# Patient Record
Sex: Female | Born: 1970 | Hispanic: No | Marital: Married | Smoking: Never smoker
Health system: Southern US, Community
[De-identification: ages and names within clinical notes are randomized; demographics above are authoritative.]

## PROBLEM LIST (undated history)

## (undated) DIAGNOSIS — R519 Headache, unspecified: Secondary | ICD-10-CM

## (undated) DIAGNOSIS — G43909 Migraine, unspecified, not intractable, without status migrainosus: Secondary | ICD-10-CM

## (undated) HISTORY — DX: Migraine, unspecified, not intractable, without status migrainosus: G43.909

## (undated) HISTORY — DX: Headache, unspecified: R51.9

---

## 2000-09-14 ENCOUNTER — Other Ambulatory Visit: Admission: RE | Admit: 2000-09-14 | Discharge: 2000-09-14 | Payer: Self-pay | Admitting: Obstetrics and Gynecology

## 2001-10-06 ENCOUNTER — Other Ambulatory Visit: Admission: RE | Admit: 2001-10-06 | Discharge: 2001-10-06 | Payer: Self-pay | Admitting: Obstetrics and Gynecology

## 2003-02-07 ENCOUNTER — Other Ambulatory Visit: Admission: RE | Admit: 2003-02-07 | Discharge: 2003-02-07 | Payer: Self-pay | Admitting: Obstetrics and Gynecology

## 2004-03-23 ENCOUNTER — Other Ambulatory Visit: Admission: RE | Admit: 2004-03-23 | Discharge: 2004-03-23 | Payer: Self-pay | Admitting: Obstetrics and Gynecology

## 2005-07-26 ENCOUNTER — Other Ambulatory Visit: Admission: RE | Admit: 2005-07-26 | Discharge: 2005-07-26 | Payer: Self-pay | Admitting: Obstetrics and Gynecology

## 2014-07-22 ENCOUNTER — Other Ambulatory Visit: Payer: Self-pay | Admitting: Nephrology

## 2014-07-22 DIAGNOSIS — N289 Disorder of kidney and ureter, unspecified: Secondary | ICD-10-CM

## 2014-07-29 ENCOUNTER — Ambulatory Visit
Admission: RE | Admit: 2014-07-29 | Discharge: 2014-07-29 | Disposition: A | Discharge status: Home / Self Care | Payer: BC Managed Care – PPO | Source: Ambulatory Visit | Attending: Nephrology | Admitting: Nephrology

## 2014-07-29 ENCOUNTER — Other Ambulatory Visit: Payer: Self-pay | Admitting: Chiropractic Medicine

## 2014-07-29 ENCOUNTER — Ambulatory Visit
Admission: RE | Admit: 2014-07-29 | Discharge: 2014-07-29 | Disposition: A | Discharge status: Home / Self Care | Payer: BC Managed Care – PPO | Source: Ambulatory Visit | Attending: Chiropractic Medicine | Admitting: Chiropractic Medicine

## 2014-07-29 DIAGNOSIS — N289 Disorder of kidney and ureter, unspecified: Secondary | ICD-10-CM

## 2014-07-29 DIAGNOSIS — R51 Headache: Secondary | ICD-10-CM

## 2016-02-25 IMAGING — CR DG CERVICAL SPINE COMPLETE 4+V
6 series · 6 of 6 positions shown · non-contrast
Comparison: None.

CLINICAL DATA: Chronic migraine headache.  Neck pain and tightness.

EXAM:
CERVICAL SPINE  4+ VIEWS

[w c-spine a.p.]
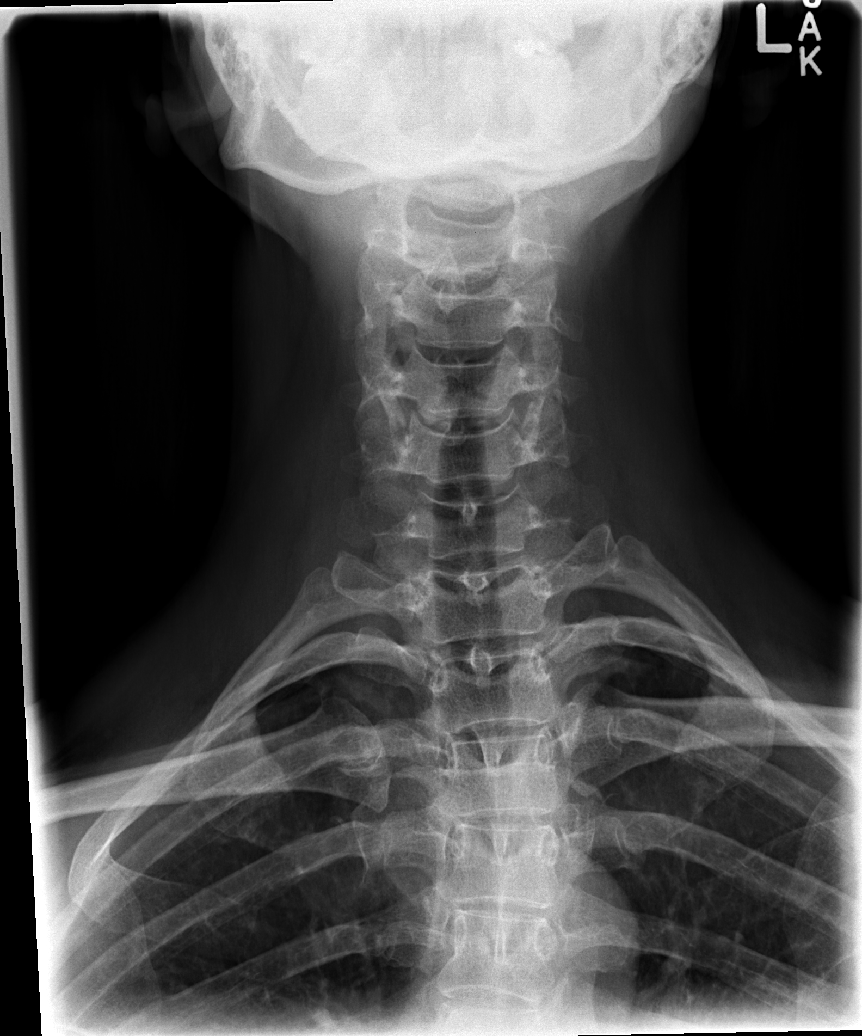

[w c-spine odontoid (1 of 2)]
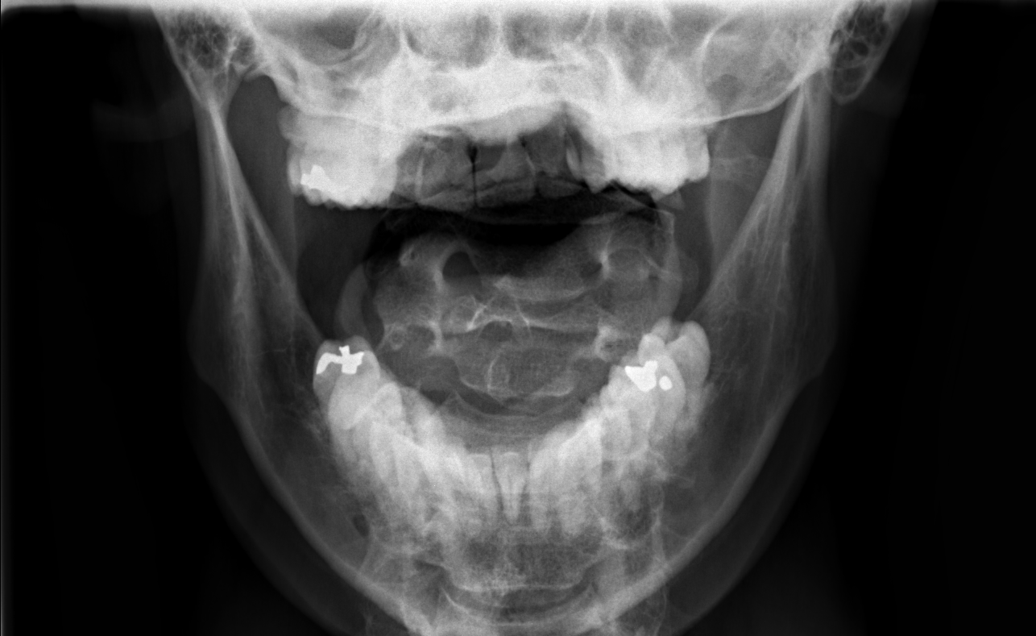

[w c-spine odontoid (2 of 2)]
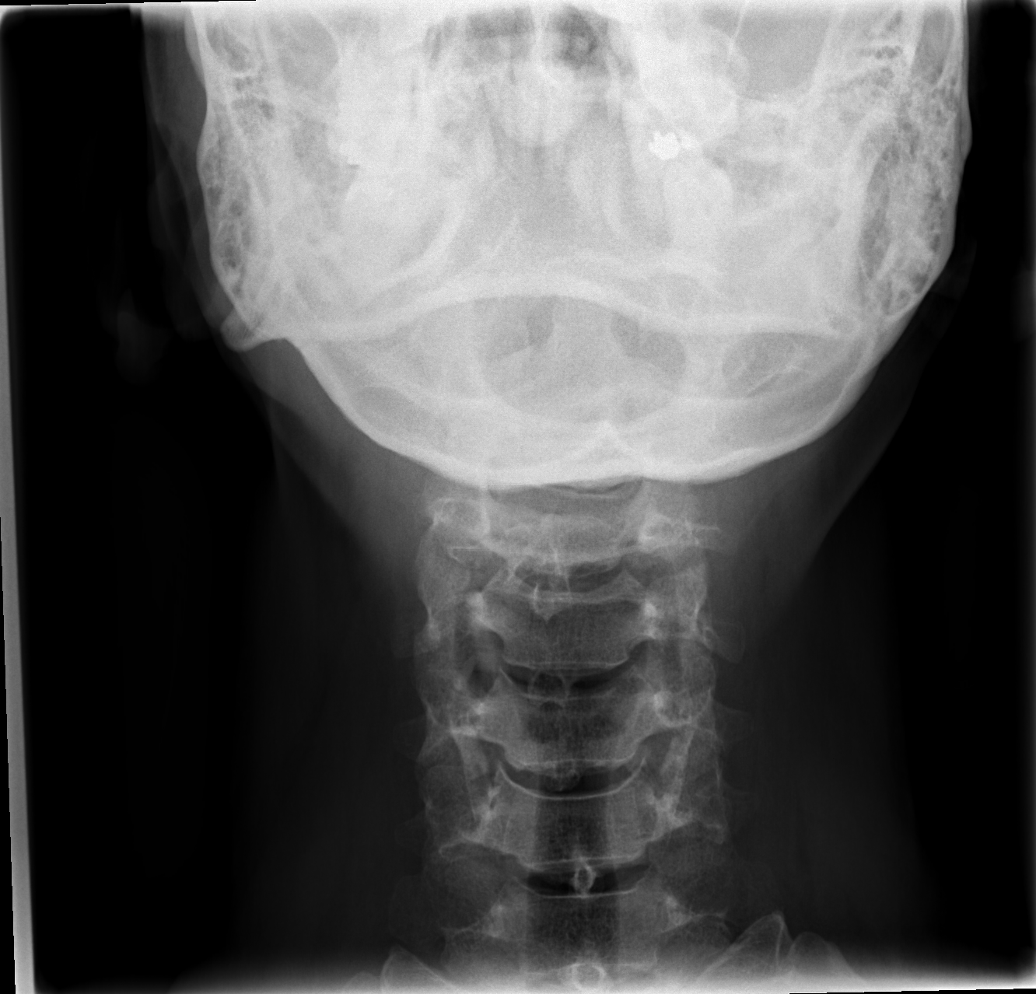

[w c-spine oblique (1 of 2)]
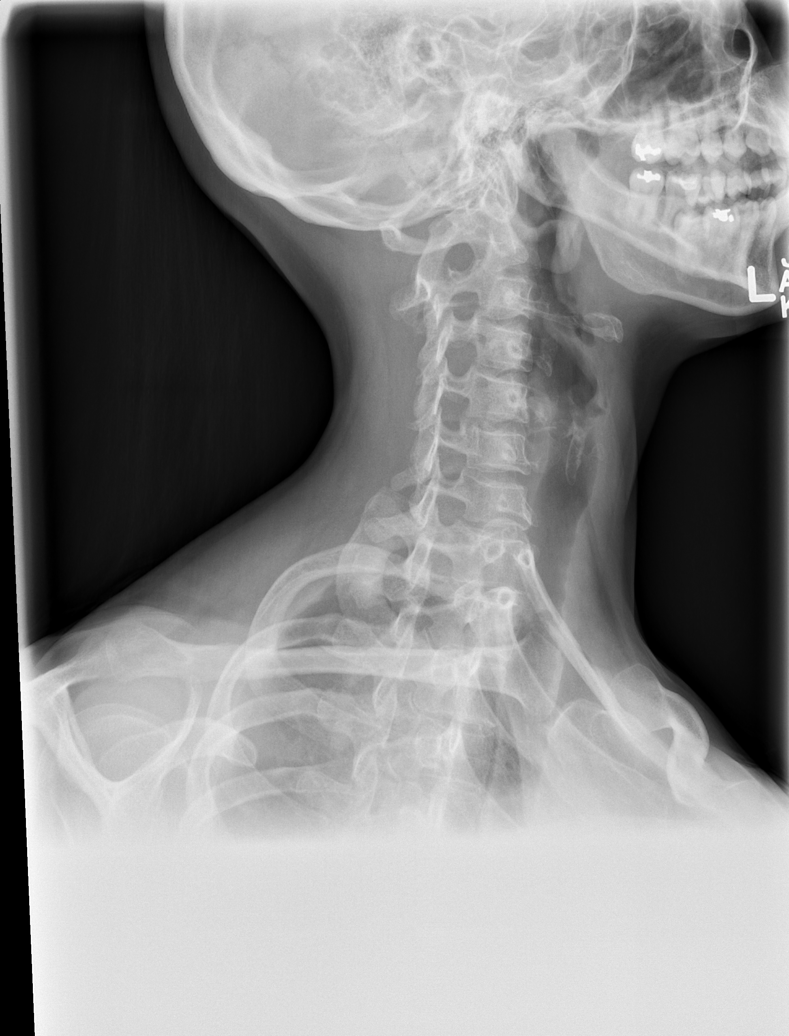

[w c-spine oblique (2 of 2)]
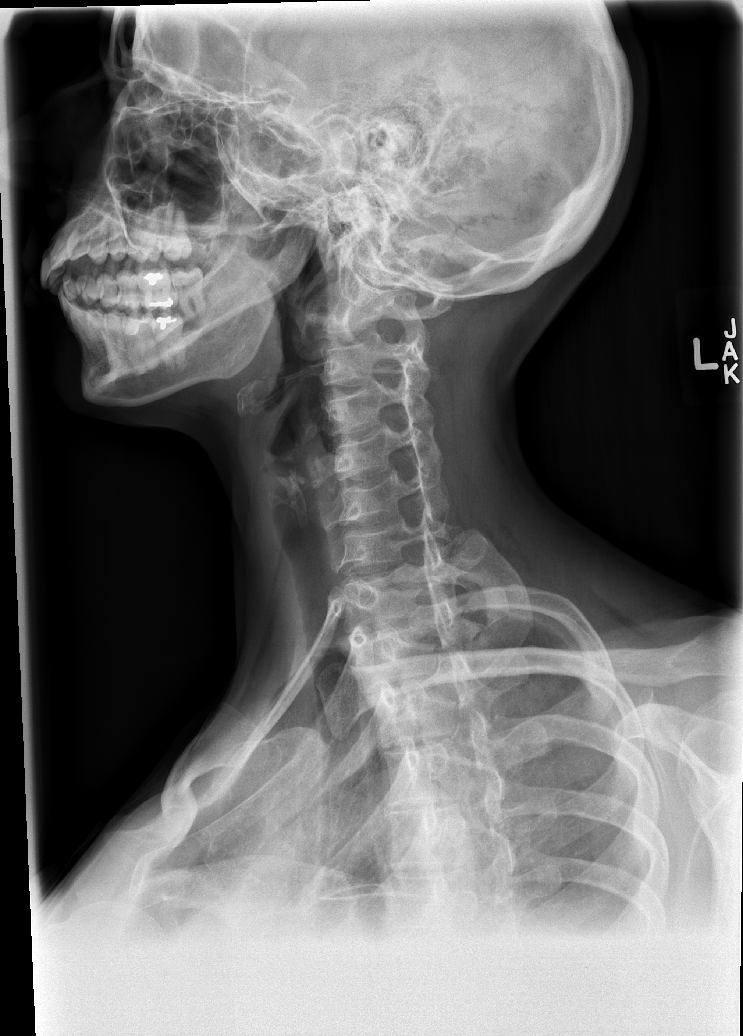

[w c-spine lat]
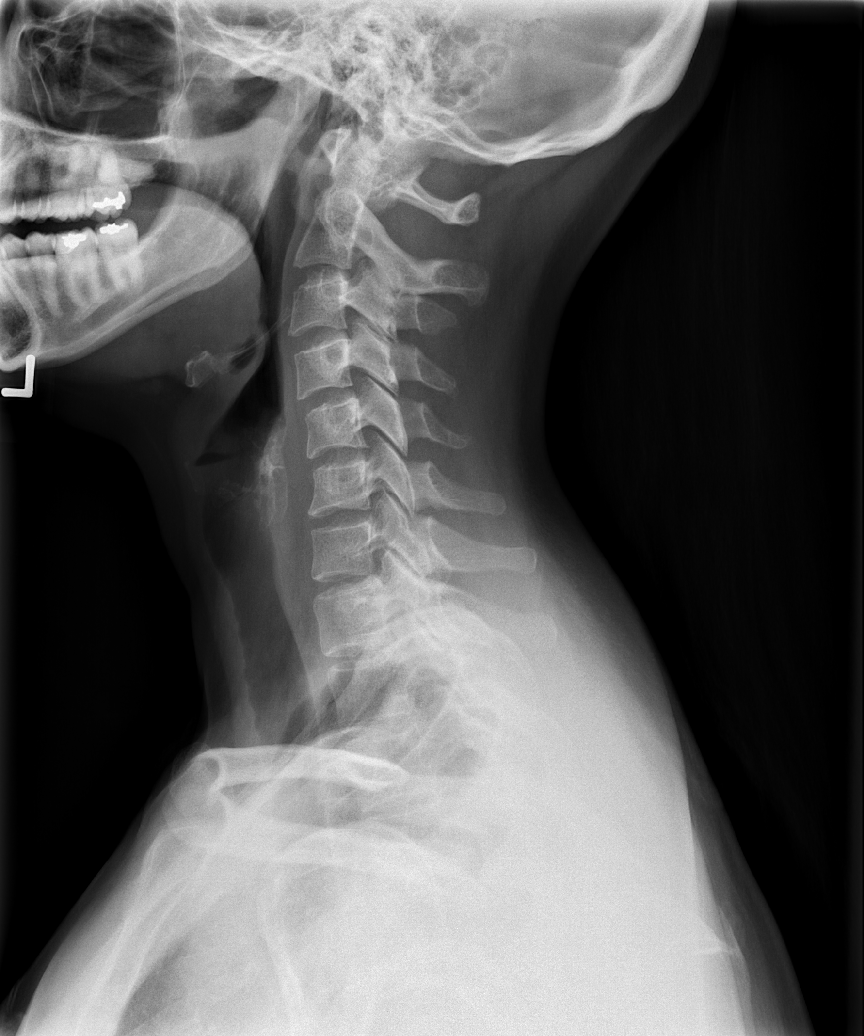

[6 of 6 positions shown; findings below may reference images not displayed]

FINDINGS: Cervical spine vertebral bodies are normally aligned from the
skullbase to the cervicothoracic junction. The vertebral bodies are
normal in height. The disc spaces are maintained. No significant
bony degenerative changes of the cervical spine. The neural foramina
are patent bilaterally. The prevertebral soft tissue contour is
normal. The trachea is midline.
IMPRESSION: Negative cervical spine radiographs.

## 2016-04-06 ENCOUNTER — Other Ambulatory Visit: Payer: Self-pay | Admitting: Obstetrics and Gynecology

## 2016-04-06 DIAGNOSIS — R928 Other abnormal and inconclusive findings on diagnostic imaging of breast: Secondary | ICD-10-CM

## 2016-04-12 ENCOUNTER — Other Ambulatory Visit: Payer: Self-pay

## 2020-10-18 ENCOUNTER — Other Ambulatory Visit: Payer: Self-pay

## 2022-02-22 ENCOUNTER — Encounter: Payer: Self-pay | Admitting: *Deleted

## 2022-02-22 ENCOUNTER — Other Ambulatory Visit: Payer: Self-pay | Admitting: *Deleted

## 2022-02-23 ENCOUNTER — Other Ambulatory Visit: Payer: Self-pay

## 2022-02-23 ENCOUNTER — Encounter: Payer: Self-pay | Admitting: Psychiatry

## 2022-02-23 ENCOUNTER — Ambulatory Visit: Payer: Managed Care, Other (non HMO) | Admitting: Psychiatry

## 2022-02-23 VITALS — BP 122/82 | HR 80 | Ht 68.0 in | Wt 170.0 lb

## 2022-02-23 DIAGNOSIS — G43109 Migraine with aura, not intractable, without status migrainosus: Secondary | ICD-10-CM | POA: Diagnosis not present

## 2022-02-23 DIAGNOSIS — M542 Cervicalgia: Secondary | ICD-10-CM | POA: Diagnosis not present

## 2022-02-23 MED ORDER — RIZATRIPTAN BENZOATE 10 MG PO TABS: ORAL_TABLET | ORAL | 3 refills | Status: DC

## 2022-02-23 NOTE — Patient Instructions (Signed)
Start Botox for headache prevention. The office will call you to schedule this once your insurance approves it ? ?Start physical therapy for the neck ?

## 2022-02-23 NOTE — Progress Notes (Signed)
Rizatriptan would not e-scribe to [employer]. Rx has been faxed to # [phone], confirmation received.  ?

## 2022-02-23 NOTE — Progress Notes (Signed)
? ?Referring:  ?Candice Camp, MD ?[address], [address] ?[place],  [place] [address] ? ?PCP: ?Irena Reichmann, DO ? ?Neurology was asked to evaluate Brenda Torres, a 51 year old female for a chief complaint of headaches.  Our recommendations of care will be communicated by shared medical record.   ? ?CC:  headaches ? ?History provided from: self ? ?HPI:  ?Medical co-morbidities: none ? ?The patient presents for evaluation of headaches which began when she was a young adult. She has noticed an increase in headache frequency in the past few months. She has had 15 headache days in the past month. Migraines are described as frontotemporal pressure with associated photophobia and phonophobia. They last up to 3 days without treatment. She takes Maxalt for rescue which generally improves her headaches within 20-30 minutes. ? 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 Photophobia: yes ? Phonophobia: yes ? Nausea: no ?Other symptoms: occipital notch tenderness ?Worse with activity?: yes ?Duration of headaches: improves in 20-30 minutes with Maxalt, can last 3 days without treatment ? ?Headache days per month: 15 ?Headache free days per month: 15 ? ?Current Treatment: ?Abortive ?Maxalt 10 mg PRN ? ?Preventative ?Magnesium ?B2 ?CoQ10 ? ?Prior Therapies                                 ?Maxalt  ?Imitrex ?Axert ?Zomig ?Frova ?Migranal ?Flurbiprofen ?ketoprofen ?Topamax 175 mg QHS - lack of efficacy ?Atenolol - lack of efficacy ?Magnesium ?B2 ? ? ?LABS: ?08/25/21: CMP with Cr 1.03, otherwise unremarkable. Vitamin D, A1c wnl ? ?IMAGING:  ?States she had a normal CT scan 20 years ago ? ?Current Outpatient Medications on File Prior to Visit  ?Medication Sig Dispense Refill  ? cetirizine (ZYRTEC) 10 MG tablet Zyrtec 10 mg tablet ? Take 1 tablet every day by oral route.    ? Cholecalciferol (VITAMIN D3 PO) Take  by mouth.    ? clindamycin-benzoyl peroxide (BENZACLIN) gel Apply topically 2 (two) times daily.    ? Coenzyme Q10-Vitamin E (QUNOL ULTRA COQ10 PO) Take by mouth.    ? levocetirizine (XYZAL) 5 MG tablet Take 5 mg by mouth every evening.    ? Multiple Vitamin (MULTIVITAMIN ADULT PO) multivitamin    ? Multiple Vitamin (MULTIVITAMIN) capsule Take 1 capsule by mouth daily.    ? norethindrone-ethinyl estradiol-FE (LOESTRIN FE) 1-20 MG-MCG tablet Take 1 tablet by mouth daily.    ? Omega-3 Fatty Acids (FISH OIL PO) Take by mouth.    ? Riboflavin (VITAMIN B2 PO) Take by mouth.    ? spironolactone (ALDACTONE) 25 MG tablet spironolactone 25 mg tablet    ? tazarotene (AVAGE) 0.1 % cream Tazorac 0.1 % topical cream ? APPLY TO THE AFFECTED AREA(S) BY TOPICAL ROUTE ONCE DAILY    ? Triamcinolone Acetonide (NASACORT ALLERGY 24HR NA) Place into the nose.    ? Turmeric (QC TUMERIC COMPLEX PO) Take by mouth.    ? ?No current facility-administered medications on file prior to visit.  ? ? ? ?Allergies: ?Allergies  ?Allergen Reactions  ? Codeine Nausea Only  ?  In college   ? ? ?Family History: ?Migraine or other headaches in the family:  mother ?Aneurysms in a first degree relative:  no ?Brain tumors in the family:  no ?Other neurological illness in the family:   mother has dementia ? ?Past Medical History: ?Past  Medical History:  ?Diagnosis Date  ? Headache   ? Migraine   ? ? ?Past Surgical History ?History reviewed. No pertinent surgical history. ? ?Social History: ?Social History  ? ?Tobacco Use  ? Smoking status: Never  ? Smokeless tobacco: Never  ?Substance Use Topics  ? Alcohol use: Yes  ?  Comment: 1-2/day  ? ? ? ?ROS: ?Negative for fevers, chills. Positive for headaches. All other systems reviewed and negative unless stated otherwise in HPI. ? ? ?Physical Exam:  ? ?Vital Signs: ?BP 122/82   Pulse 80   Ht 5\' 8"  (1.727 m)   Wt 170 lb (77.1 kg)   SpO2 99%   BMI 25.85 kg/m?  ?GENERAL: well appearing,in no acute  distress,alert ?SKIN:  Color, texture, turgor normal. No rashes or lesions ?HEAD:  Normocephalic/atraumatic. ?CV:  RRR ?RESP: Normal respiratory effort ?MSK: +tenderness to palpation over bilateral occiput, neck, and shoulders ? ?NEUROLOGICAL: ?Mental Status: Alert, oriented to person, place and time,Follows commands ?Cranial Nerves: PERRL, visual fields intact to confrontation, extraocular movements intact, facial sensation intact, no facial droop or ptosis, hearing grossly intact, no dysarthria ?Motor: muscle strength 5/5 both upper and lower extremities,no drift, normal tone ?Reflexes: 2+ throughout ?Sensation: intact to light touch all 4 extremities ?Coordination: Finger-to- nose-finger intact bilaterally ?Gait: normal-based ? ? ?IMPRESSION: ?51 year old female who presents for evaluation of chronic migraines. She has failed multiple oral medications due to lack of efficacy. Will start Botox for migraine prevention, which may also help with her neck pain and tension. Maxalt works well for rescue, will continue this for now. Referral to neck PT placed for cervicalgia. Discussed increased stroke risk in patients >35 who have migraine with aura and use estrogen. Advised patient to discuss with ob/gyn about non-estrogen options. She does take ASA 81 daily for stroke prevention. ? ?PLAN: ?-Prevention: Start Botox every 3 months ?-Rescue: Continue Maxalt 10 mg PRN ?-Referral to neck PT for cervicalgia ?-Advised patient to discuss non-estrogen containing birth control options ?-next steps: consider CGRP, amitriptyline ? ?I spent a total of 35 minutes chart reviewing and counseling the patient. Headache education was done. Discussed treatment options including preventive and acute medications, natural supplements, and physical therapy. Discussed medication overuse headache and to limit use of acute treatments to no more than 2 days/week or 10 days/month. Discussed medication side effects, adverse reactions and drug  interactions. Written educational materials and patient instructions outlining all of the above were given. ? ?Follow-up: for Botox ? ? ?54, MD ?02/23/2022   ?11:00 AM ? ? ?

## 2022-03-04 NOTE — Therapy (Signed)
?OUTPATIENT PHYSICAL THERAPY CERVICAL EVALUATION ? ? ?Patient Name: Brenda Torres ?MRN: [id number] ?DOB:1970/12/28, 51 y.o., female ?Today's Date: 03/05/2022 ? ? PT End of Session - 03/05/22 1325   ? ? Visit Number 1   ? Number of Visits 17   ? Date for PT Re-Evaluation 05/07/22   ? Authorization Type [employer]   ? Progress Note Due on Visit 10   ? PT Start Time 1320   ? PT Stop Time 1405   ? PT Time Calculation (min) 45 min   ? Activity Tolerance Patient tolerated treatment well   ? Behavior During Therapy [facility] for tasks assessed/performed   ? ?  ?  ? ?  ? ? ?Past Medical History:  ?Diagnosis Date  ? Headache   ? Migraine   ? ?History reviewed. No pertinent surgical history. ?There are no problems to display for this patient. ? ? ?PCP: Janie Morning, DO ? ?REFERRING PROVIDER: Genia Harold, MD ? ?REFERRING DIAG: Cervicalgia ? ?THERAPY DIAG:  ?Cervicalgia ? ?Cramp and spasm ? ?ONSET DATE: Since children ? ?SUBJECTIVE:                                                                                                                                                                                                        ? ?SUBJECTIVE STATEMENT: ?Pt reports she has neck pain and tension Has 50% of the time. The HAs usually last for 3 days. Her currently HA has lasted 5 days.  ? ?PERTINENT HISTORY:  ?NA ? ?PAIN:  ?Are you having pain? Yes: NPRS scale: 4/10 ?Pain location: neck, ram's horn ?Pain description: ache ?Aggravating factors: No consitent ?Reiving factors: Cold/heat, massage with racketball ?Range 4-6/10 ? ?PRECAUTIONS: None ? ?WEIGHT BEARING RESTRICTIONS No ? ?FALLS:  ?Has patient fallen in last 6 months? No ? ?LIVING ENVIRONMENT: ?No issues ? ?OCCUPATION: Sales ? ?PLOF: Independent ? ?PATIENT GOALS Pain relief ? ?OBJECTIVE:  ? ?DIAGNOSTIC FINDINGS:  ?NA ? ?COGNITION: ?Overall cognitive status: Within functional limits for tasks assessed ? ? ?SENSATION: ?WFL ? ?POSTURE:  ?Forward head c CT step ? ?PALPATION: ?TTP of  the suboccipital, cervical paraspinals, and upper traps R >L  ? ?CERVICAL ROM:  ? ?Active ROM A/PROM (deg) ?03/05/2022  ?Flexion 30  ?Extension 60  ?Right lateral flexion 30  ?Left lateral flexion 25  ?Right rotation 53  ?Left rotation 50  ? Pt reports neck muscle tightness with all cervical motions ? ?UE ROM: ?  UE AROMs are grossly WNLs ? ? ?UE MMT: ?  UE myotome screen is Neg ? ?CERVICAL SPECIAL TESTS:  ?  Spurling's test: Negative and Suboccipital release distraction decreased pt's HA  ? ? ?TODAY'S TREATMENT:  ?- Seated Passive Cervical Retraction  5 reps - 3 hold ?- Supine Cervical Retraction with Towel  10 reps - 3 hold ?- Standing Cervical Retraction with Sidebending  2 reps - 3 hold ?- Seated Cervical Retraction and Rotation  2 reps - 3 hold ?- Seated Upper Trapezius Stretch  2 reps - 15 hold ? ? ?PATIENT EDUCATION:  ?Education details: Eval findings, POC, HEP ?Person educated: Patient ?Education method: Explanation, Demonstration, Tactile cues, Verbal cues, and Handouts ?Education comprehension: verbalized understanding, returned demonstration, verbal cues required, and tactile cues required ? ? ?HOME EXERCISE PROGRAM: ?Access Code: [email or web address] ?URL: [email or web address] ?Date: 03/05/2022 ?Prepared by: Gar Ponto ? ?Exercises ?- Seated Passive Cervical Retraction  - 3 x daily - 7 x weekly - 3 sets - 3-10 reps - 3 hold ?- Supine Cervical Retraction with Towel  - 3 x daily - 7 x weekly - 3 sets - 3-10 reps - 3 hold ?- Standing Cervical Retraction with Sidebending  - 3 x daily - 7 x weekly - 3 sets - 3-10 reps - 3 hold ?- Seated Cervical Retraction and Rotation  - 3 x daily - 7 x weekly - 3 sets - 3-10 reps - 3 hold ?- Seated Upper Trapezius Stretch  - 3 x daily - 7 x weekly - 3 sets - 3-10 reps - 15 hold ? ?ASSESSMENT: ? ?CLINICAL IMPRESSION: ?Patient is a 51 y.o. F who was seen today for physical therapy evaluation and treatment for cervicalgia and HAs.  ? ? ?OBJECTIVE IMPAIRMENTS decreased ROM,  decreased strength, increased muscle spasms, impaired flexibility, postural dysfunction, and pain.  ? ?ACTIVITY LIMITATIONS driving and occupation.  ? ?PERSONAL FACTORS Time since onset of injury/illness/exacerbation are also affecting patient's functional outcome.  ? ? ?REHAB POTENTIAL: Good ? ?CLINICAL DECISION MAKING: Evolving/moderate complexity ? ?EVALUATION COMPLEXITY: Moderate ? ? ?GOALS: ? ?SHORT TERM GOALS=LTGs ? ? ?LONG TERM GOALS: Target date: 04/23/22 ? ?Pt will report a 25% decrease in the incidents of HAs ?Baseline: Experiences Has 50% of the time  ?Goal status: INITIAL ? ?2.  Pt's cervical ROMs will increase by 10d for improved neck function ?Baseline: See flow sheets ?Goal status: INITIAL ? ?3.  Pt will be Ind in a final HEP to maintain achieved LOF and QOL ?Baseline: started on eval ?Goal status: INITIAL ? ?4.  Pt will voice understanding of measures to assist in the management of cervical pain and HAs ?Baseline:  ?Goal status: INITIAL ? ? ?PLAN: ?PT FREQUENCY: 1x/week ? ?PT DURATION: 6 weeks ? ?PLANNED INTERVENTIONS: Therapeutic exercises, Therapeutic activity, Patient/Family education, Dry Needling, Electrical stimulation, Spinal mobilization, Cryotherapy, Moist heat, Taping, Traction, Ultrasound, Ionotophoresis 4mg /ml Dexamethasone, and Manual therapy ? ?PLAN FOR NEXT SESSION: Assess response to HEP, use manual care and TPDN as indicated ? ?Gar Ponto MS, PT ?03/05/22 9:25 PM ? ? ? ? ? ? ? ?

## 2022-03-05 ENCOUNTER — Ambulatory Visit: Payer: Managed Care, Other (non HMO) | Attending: Psychiatry

## 2022-03-05 DIAGNOSIS — M542 Cervicalgia: Secondary | ICD-10-CM | POA: Diagnosis present

## 2022-03-05 DIAGNOSIS — R252 Cramp and spasm: Secondary | ICD-10-CM | POA: Insufficient documentation

## 2022-03-09 ENCOUNTER — Encounter: Payer: Self-pay | Admitting: Psychiatry

## 2022-03-10 ENCOUNTER — Telehealth: Payer: Self-pay | Admitting: Psychiatry

## 2022-03-10 DIAGNOSIS — G43109 Migraine with aura, not intractable, without status migrainosus: Secondary | ICD-10-CM

## 2022-03-10 NOTE — Telephone Encounter (Signed)
Completed a Botox PA form for [employer], placed in Nurse pod for MD signature. ?

## 2022-03-11 NOTE — Telephone Encounter (Signed)
Faxed signed PA form with OV notes to [employer]. ? ? ?

## 2022-03-17 ENCOUNTER — Encounter: Payer: Self-pay | Admitting: Psychiatry

## 2022-03-18 ENCOUNTER — Ambulatory Visit: Payer: Managed Care, Other (non HMO) | Attending: Psychiatry

## 2022-03-18 DIAGNOSIS — M542 Cervicalgia: Secondary | ICD-10-CM | POA: Diagnosis present

## 2022-03-18 DIAGNOSIS — R252 Cramp and spasm: Secondary | ICD-10-CM | POA: Diagnosis present

## 2022-03-18 NOTE — Therapy (Signed)
?OUTPATIENT PHYSICAL THERAPY TREATMENT NOTE ? ? ?Patient Name: Brenda Torres ?MRN: [id number] ?DOB:October 19, 1971, 51 y.o., female ?Today's Date: 03/18/2022 ? ?PCP: Janie Morning, DO ?REFERRING PROVIDER: Janie Morning, DO ? ?END OF SESSION:  ? PT End of Session - 03/18/22 1648   ? ? Visit Number 2   ? Number of Visits 7   ? Date for PT Re-Evaluation 04/23/22   ? Authorization Type [employer]   ? Progress Note Due on Visit 10   ? PT Start Time 2956   ? PT Stop Time 1735   6 minutes of dry needle insertion  ? PT Time Calculation (min) 45 min   ? Activity Tolerance Patient tolerated treatment well   ? Behavior During Therapy [facility] for tasks assessed/performed   ? ?  ?  ? ?  ? ? ?Past Medical History:  ?Diagnosis Date  ? Headache   ? Migraine   ? ?History reviewed. No pertinent surgical history. ?There are no problems to display for this patient. ? ? ?REFERRING DIAG: Cervicalgia ? ?THERAPY DIAG:  ?Cervicalgia ? ?Cramp and spasm ? ? ?SUBJECTIVE: Pt reports continued Rt sided neck pain and stiffness. She reports daily adherence to her HEP.  ? ?PAIN:  ?Are you having pain? Yes: NPRS scale: 1-2/10 ?Pain location: neck, ram's horn ?Pain description: ache ?Aggravating factors: No consitent ?Reiving factors: Cold/heat, massage with racketball ?Range 4-6/10 ? ? ? ? ? ?OBJECTIVE:  ?*Unless otherwise noted, objective information collected previously*  ?DIAGNOSTIC FINDINGS:  ?NA ?  ?COGNITION: ?Overall cognitive status: Within functional limits for tasks assessed ?  ?  ?SENSATION: ?WFL ?  ?POSTURE:  ?Forward head c CT step ?  ?PALPATION: ?TTP of the suboccipital, cervical paraspinals, and upper traps R >L   ?  ?CERVICAL ROM:  ?  ?Active ROM A/PROM (deg) ?03/05/2022  ?Flexion 30  ?Extension 60  ?Right lateral flexion 30  ?Left lateral flexion 25  ?Right rotation 53  ?Left rotation 50  ? Pt reports neck muscle tightness with all cervical motions ?  ?UE ROM: ?                      UE AROMs are grossly WNLs ?  ?  ?UE MMT: ?                       UE myotome screen is Neg ?  ?CERVICAL SPECIAL TESTS:  ?Spurling's test: Negative and Suboccipital release distraction decreased pt's HA  ?  ?  ?TODAY'S TREATMENT:  ? ?[place] [facility] Treatment:                                                DATE: 03/18/2022 ?Therapeutic Exercise: ?Seated low rows with 30# cable and chin tuck hold 2x10 ?Seated high rows with 30# cable and chin tuck hold 2x10 ?Seated lat pull-down with 30# cable and chin tuck hold 2x10 ?Seated shoulder rolls 2x10 forward and backward ?Prone A with chin tuck and 3# dumbbells 2x10 ?Prone T with chin tuck hold and 3# dumbbells ?Prone Y with chin tuck hold and 1# dumbbells 2x10 ?Manual Therapy: ?Skilled palpation to identify trigger points prior to TPDN ?Supine cervical rotation contract/ co-contract MET x5 with 30sec hold at end range BIL ?Supine suboccipital release/ effleurage to cervical multifidi ?Seated Lt UT pin and stretch 2x30sec ?Seated STM  to BIL UT ?Neuromuscular re-ed: ?N/A ?Therapeutic Activity: ?N/A ?Modalities: ?N/A ?Self Care: ?N/A ? ?Trigger Point Dry-Needling  ?Treatment instructions: Expect mild to moderate muscle soreness. S/S of pneumothorax if dry needled over a lung field, and to seek immediate medical attention should they occur. Patient verbalized understanding of these instructions and education. ? ?Patient Consent Given: Yes ?Education handout provided: Yes ?Muscles treated: BIL suboccipitals, BIL splenius capitus, Rt C4-C5 cervical multifidi, BIL UT ?Electrical stimulation performed: No ?Parameters: N/A ?Treatment response/outcome: Multiple twitch responses and improved muscle extensibility ? ? ?03/05/2022 ?- Seated Passive Cervical Retraction  5 reps - 3 hold ?- Supine Cervical Retraction with Towel  10 reps - 3 hold ?- Standing Cervical Retraction with Sidebending  2 reps - 3 hold ?- Seated Cervical Retraction and Rotation  2 reps - 3 hold ?- Seated Upper Trapezius Stretch  2 reps - 15 hold ?  ?  ?PATIENT EDUCATION:  ?Education  details: Eval findings, POC, HEP ?Person educated: Patient ?Education method: Explanation, Demonstration, Tactile cues, Verbal cues, and Handouts ?Education comprehension: verbalized understanding, returned demonstration, verbal cues required, and tactile cues required ?  ?  ?HOME EXERCISE PROGRAM: ?Access Code: [email or web address] ?URL: [email or web address] ?Date: 03/05/2022 ?Prepared by: Gar Ponto ?  ?Exercises ?- Seated Passive Cervical Retraction  - 3 x daily - 7 x weekly - 3 sets - 3-10 reps - 3 hold ?- Supine Cervical Retraction with Towel  - 3 x daily - 7 x weekly - 3 sets - 3-10 reps - 3 hold ?- Standing Cervical Retraction with Sidebending  - 3 x daily - 7 x weekly - 3 sets - 3-10 reps - 3 hold ?- Seated Cervical Retraction and Rotation  - 3 x daily - 7 x weekly - 3 sets - 3-10 reps - 3 hold ?- Seated Upper Trapezius Stretch  - 3 x daily - 7 x weekly - 3 sets - 3-10 reps - 15 hold ?  ?ASSESSMENT: ?  ?CLINICAL IMPRESSION: ?Pt responded excellently to all interventions today, demonstrating good form and no pain with performed therapeutic exercises. She also responded well to TPDN today, reporting therapeutic effect and experiencing no adverse response to treatment. Multiple twitch responses were achieved in needled muscle and tissue extensibility was palpably improved following treatment. She also reports a therapeutic response to manual techniques today. The pt will continue to benefit from skilled PT to address her primary impairments and return to her prior level of function with less limitation. ?  ?  ?OBJECTIVE IMPAIRMENTS decreased ROM, decreased strength, increased muscle spasms, impaired flexibility, postural dysfunction, and pain.  ?  ?ACTIVITY LIMITATIONS driving and occupation.  ?  ?PERSONAL FACTORS Time since onset of injury/illness/exacerbation are also affecting patient's functional outcome.  ?  ?  ?REHAB POTENTIAL: Good ?  ?CLINICAL DECISION MAKING: Evolving/moderate complexity ?   ?EVALUATION COMPLEXITY: Moderate ?  ?  ?GOALS: ?  ?SHORT TERM GOALS=LTGs ?  ?  ?LONG TERM GOALS: Target date: 04/23/22 ?  ?Pt will report a 25% decrease in the incidents of HAs ?Baseline: Experiences Has 50% of the time  ?Goal status: INITIAL ?  ?2.  Pt's cervical ROMs will increase by 10d for improved neck function ?Baseline: See flow sheets ?Goal status: INITIAL ?  ?3.  Pt will be Ind in a final HEP to maintain achieved LOF and QOL ?Baseline: started on eval ?Goal status: INITIAL ?  ?4.  Pt will voice understanding of measures to assist in the management of cervical pain and HAs ?Baseline:  ?  Goal status: INITIAL ?  ?  ?PLAN: ?PT FREQUENCY: 1x/week ?  ?PT DURATION: 6 weeks ?  ?PLANNED INTERVENTIONS: Therapeutic exercises, Therapeutic activity, Patient/Family education, Dry Needling, Electrical stimulation, Spinal mobilization, Cryotherapy, Moist heat, Taping, Traction, Ultrasound, Ionotophoresis 35m/ml Dexamethasone, and Manual therapy ?  ?PLAN FOR NEXT SESSION: Assess response to HEP, use manual care and TPDN as indicated ? ? ? ?TCherie Ouch PT ?03/18/2022, 5:35 PM ? ?  ? ?

## 2022-03-18 NOTE — Patient Instructions (Signed)

## 2022-03-25 NOTE — Therapy (Signed)
?OUTPATIENT PHYSICAL THERAPY TREATMENT NOTE ? ? ?Patient Name: Brenda Torres ?MRN: [id number] ?DOB:[birth year], 51 y.o., female ?Today's Date: 03/26/2022 ? ?PCP: Janie Morning, DO ?REFERRING PROVIDER: Janie Morning, DO ? ?END OF SESSION:  ? PT End of Session - 03/26/22 1440   ? ? Visit Number 3   ? Number of Visits 7   ? Date for PT Re-Evaluation 04/23/22   ? Authorization Type [employer]   ? Progress Note Due on Visit 10   ? PT Start Time 1316   ? PT Stop Time 1400   ? PT Time Calculation (min) 44 min   ? Activity Tolerance Patient tolerated treatment well   ? Behavior During Therapy [facility] for tasks assessed/performed   ? ?  ?  ? ?  ? ? ? ?Past Medical History:  ?Diagnosis Date  ? Headache   ? Migraine   ? ?No past surgical history on file. ?There are no problems to display for this patient. ? ? ?REFERRING DIAG: Cervicalgia ? ?THERAPY DIAG:  ?Cervicalgia ? ?Cramp and spasm ? ? ?SUBJECTIVE: I had a HA Sat, Sun and Monday. By Monday the soreness had resolved after the TPDN and I have not had a HA this week. ? ?PAIN:  ?Are you having pain? Yes: NPRS scale: 0/10 ?Pain location: neck, ram's horn ?Pain description: ache ?Aggravating factors: No consitent ?Reiving factors: Cold/heat, massage with racketball ?Range 4-6/10 ? ?OBJECTIVE:  ?*Unless otherwise noted, objective information collected previously*  ?DIAGNOSTIC FINDINGS:  ?NA ?  ?COGNITION: ?Overall cognitive status: Within functional limits for tasks assessed ?  ?  ?SENSATION: ?WFL ?  ?POSTURE:  ?Forward head c CT step ?  ?PALPATION: ?TTP of the suboccipital, cervical paraspinals, and upper traps R >L   ?  ?CERVICAL ROM:  ?  ?Active ROM A/PROM (deg) ?03/05/2022  ?Flexion 30  ?Extension 60  ?Right lateral flexion 30  ?Left lateral flexion 25  ?Right rotation 53  ?Left rotation 50  ? Pt reports neck muscle tightness with all cervical motions ?  ?UE ROM: ?                      UE AROMs are grossly WNLs ?  ?  ?UE MMT: ?                      UE myotome screen is Neg ?   ?CERVICAL SPECIAL TESTS:  ?Spurling's test: Negative and Suboccipital release distraction decreased pt's HA  ?  ?  ?TODAY'S TREATMENT:  ?[facility] Adult PT Treatment:                                                DATE: 03/26/22 ?Therapeutic Exercise: ?Cervical Retraction  5 reps - 3 hold ?Cervical Retraction with Sidebending  3 reps - 3 hold ?Cervical Retraction and Rotation  3 reps - 3 hold ?Manual Therapy: ?STM to the bilat suboccipitals, cervical paraspinals, upper traps, and ant. temporalis ? ?Trigger Point Dry Needling Treatment: ?Skilled palpation for the Identification of taut muscles and TPs ?Pre-treatment instruction: Patient instructed on dry needling rationale, procedures, and possible side effects including pain during treatment (achy,cramping feeling), bruising, drop of blood, lightheadedness, nausea, sweating. ?Patient Consent Given: Yes ?Education handout provided: Previously provided ?Muscles treated: Bilat suboccipials and splenius capitus ?Needle size and number:  .30x53m 2 ?Electrical stimulation performed: No ?  Parameters: N/A ?Treatment response/outcome: Twitch response elicited and Palpable decrease in muscle tension ?Post-treatment instructions: Patient instructed to expect possible mild to moderate muscle soreness later today and/or tomorrow. Patient instructed in methods to reduce muscle soreness and to continue prescribed HEP. If patient was dry needled over the lung field, patient was instructed on signs and symptoms of pneumothorax and, however unlikely, to see immediate medical attention should they occur. Patient was also educated on signs and symptoms of infection and to seek medical attention should they occur. Patient verbalized understanding of these instructions and education. ? ? ?[facility] Adult PT Treatment:                                                DATE: 03/18/2022 ?Therapeutic Exercise: ?Seated low rows with 30# cable and chin tuck hold 2x10 ?Seated high rows with 30# cable and  chin tuck hold 2x10 ?Seated lat pull-down with 30# cable and chin tuck hold 2x10 ?Seated shoulder rolls 2x10 forward and backward ?Prone A with chin tuck and 3# dumbbells 2x10 ?Prone T with chin tuck hold and 3# dumbbells ?Prone Y with chin tuck hold and 1# dumbbells 2x10 ?Manual Therapy: ?Skilled palpation to identify trigger points prior to TPDN ?Supine cervical rotation contract/ co-contract MET x5 with 30sec hold at end range BIL ?Supine suboccipital release/ effleurage to cervical multifidi ?Seated Lt UT pin and stretch 2x30sec ?Seated STM to BIL UT ?Neuromuscular re-ed: ?N/A ?Therapeutic Activity: ?N/A ?Modalities: ?N/A ?Self Care: ?N/A ? ?Trigger Point Dry-Needling  ?Treatment instructions: Expect mild to moderate muscle soreness. S/S of pneumothorax if dry needled over a lung field, and to seek immediate medical attention should they occur. Patient verbalized understanding of these instructions and education. ? ?Patient Consent Given: Yes ?Education handout provided: Yes ?Muscles treated: BIL suboccipitals, BIL splenius capitus, Rt C4-C5 cervical multifidi, BIL UT ?Electrical stimulation performed: No ?Parameters: N/A ?Treatment response/outcome: Multiple twitch responses and improved muscle extensibility ? ? ?03/05/2022 ?- Seated Passive Cervical Retraction  5 reps - 3 hold ?- Supine Cervical Retraction with Towel  10 reps - 3 hold ?- Standing Cervical Retraction with Sidebending  2 reps - 3 hold ?- Seated Cervical Retraction and Rotation  2 reps - 3 hold ?- Seated Upper Trapezius Stretch  2 reps - 15 hold ?  ?  ?PATIENT EDUCATION:  ?Education details: Eval findings, POC, HEP ?Person educated: Patient ?Education method: Explanation, Demonstration, Tactile cues, Verbal cues, and Handouts ?Education comprehension: verbalized understanding, returned demonstration, verbal cues required, and tactile cues required ?  ?  ?HOME EXERCISE PROGRAM: ?Access Code: [email or web address] ?URL: [email or web address] ?Date:  03/05/2022 ?Prepared by: Gar Ponto ?  ?Exercises ?- Seated Passive Cervical Retraction  - 3 x daily - 7 x weekly - 3 sets - 3-10 reps - 3 hold ?- Supine Cervical Retraction with Towel  - 3 x daily - 7 x weekly - 3 sets - 3-10 reps - 3 hold ?- Standing Cervical Retraction with Sidebending  - 3 x daily - 7 x weekly - 3 sets - 3-10 reps - 3 hold ?- Seated Cervical Retraction and Rotation  - 3 x daily - 7 x weekly - 3 sets - 3-10 reps - 3 hold ?- Seated Upper Trapezius Stretch  - 3 x daily - 7 x weekly - 3 sets - 3-10 reps - 15 hold ?  ?ASSESSMENT: ?  ?  CLINICAL IMPRESSION: ?PT was completed for STM to the suboccipitals, cervical paraspinals, upper trap and ant. temporalis f/b TPDN to the blat suboccipitals and splenius capitus. Pt then completed cervical ROM and flexibility therex per cervical retraction including rotation and lat flexion. Pt tolerated the session without adverse effects. Pt notes the PT sessions and HEP have gone well, but it is too soon to know the benefit of PT as of yet.  ?  ?OBJECTIVE IMPAIRMENTS decreased ROM, decreased strength, increased muscle spasms, impaired flexibility, postural dysfunction, and pain.  ?  ?ACTIVITY LIMITATIONS driving and occupation.  ?  ?PERSONAL FACTORS Time since onset of injury/illness/exacerbation are also affecting patient's functional outcome.  ?  ?  ?REHAB POTENTIAL: Good ?  ?CLINICAL DECISION MAKING: Evolving/moderate complexity ?  ?EVALUATION COMPLEXITY: Moderate ?  ?  ?GOALS: ?  ?SHORT TERM GOALS=LTGs ?  ?  ?LONG TERM GOALS: Target date: 04/23/22 ?  ?Pt will report a 25% decrease in the incidents of HAs ?Baseline: Experiences Has 50% of the time  ?Goal status: INITIAL ?  ?2.  Pt's cervical ROMs will increase by 10d for improved neck function ?Baseline: See flow sheets ?Goal status: INITIAL ?  ?3.  Pt will be Ind in a final HEP to maintain achieved LOF and QOL ?Baseline: started on eval ?Goal status: INITIAL ?  ?4.  Pt will voice understanding of measures to  assist in the management of cervical pain and HAs ?Baseline:  ?Goal status: INITIAL ?  ?  ?PLAN: ?PT FREQUENCY: 1x/week ?  ?PT DURATION: 6 weeks ?  ?PLANNED INTERVENTIONS: Therapeutic exercises, Therapeutic activity

## 2022-03-26 ENCOUNTER — Ambulatory Visit: Payer: Managed Care, Other (non HMO)

## 2022-03-26 DIAGNOSIS — M542 Cervicalgia: Secondary | ICD-10-CM

## 2022-03-26 DIAGNOSIS — R252 Cramp and spasm: Secondary | ICD-10-CM

## 2022-03-30 NOTE — Telephone Encounter (Signed)
Please send Botox RX to [facility]. 

## 2022-03-30 NOTE — Telephone Encounter (Signed)
Received approval from [place], [place] # [id number] (03/11/2022-03/12/2023). ?

## 2022-03-31 MED ORDER — ONABOTULINUMTOXINA 100 UNITS IJ SOLR: 100.0000 [IU] | Freq: Once | INTRAMUSCULAR | Status: DC

## 2022-03-31 NOTE — Addendum Note (Signed)
Addended by: [place] Pink on: 03/31/2022 10:26 AM ? ? Modules accepted: Orders ? ?

## 2022-03-31 NOTE — Telephone Encounter (Signed)
Per Danne Harbor, Botox coordinator:  ?She gets 155 units so we would need to order a 200 unit vial quantity of 1.   ?Botox 100 units x 2 vials ordered with co sign required by Dr Marjory Lies as work in [place] in Dr Quentin Mulling absence.  ?

## 2022-04-01 NOTE — Therapy (Signed)
?OUTPATIENT PHYSICAL THERAPY TREATMENT NOTE ? ? ?Patient Name: Brenda Torres ?MRN: [id number] ?DOB:03/04/1971, 51 y.o., female ?Today's Date: 04/02/2022 ? ?PCP: Irena Reichmannollins, Dana, DO ?REFERRING PROVIDER: Irena Reichmannollins, Dana, DO ? ?END OF SESSION:  ? PT End of Session - 04/02/22 1433   ? ? Visit Number 4   ? Number of Visits 7   ? Date for PT Re-Evaluation 04/23/22   ? Authorization Type [employer]   ? Progress Note Due on Visit 10   ? PT Start Time 1315   ? PT Stop Time 1436   ? PT Time Calculation (min) 81 min   ? Activity Tolerance Patient tolerated treatment well   ? [facility] tasks assessed/performed   ? ?  ?  ? ?  ? ? ? ? ?Past Medical History:  ?Diagnosis Date  ? Headache   ? Migraine   ? ?History reviewed. No pertinent surgical history. ?There are no problems to display for this patient. ? ? ?REFERRING DIAG: Cervicalgia ? ?THERAPY DIAG:  ?Cervicalgia ? ?Cramp and spasm ? ? ?SUBJECTIVE:  ?The frequency of my HAs is about the same 3 to 4x a week. On her own 0-5 scale, the intensity has decreased from a 3/5 to a 2/5. Pt notes her Has seem to be primarily on the side of head. Pt notes she does grind her teeth at night. ? ?PAIN:  ?Are you having pain? Yes: NPRS scale: 0/10 ?Pain location: neck, ram's horn ?Pain description: ache ?Aggravating factors: No consitent ?Reiving factors: Cold/heat, massage with racketball ?Range 4-6/10 ? ?OBJECTIVE:  ?*Unless otherwise noted, objective information collected previously*  ?DIAGNOSTIC FINDINGS:  ?NA ?  ?COGNITION: ?Overall cognitive status: Within functional limits for tasks assessed ?  ?  ?SENSATION: ?WFL ?  ?POSTURE:  ?Forward head c CT step ?  ?PALPATION: ?TTP of the suboccipital, cervical paraspinals, and upper traps R >L   ?  ?CERVICAL ROM:  ?  ?Active ROM A/PROM (deg) ?03/05/2022  ?Flexion 30  ?Extension 60  ?Right lateral flexion 30  ?Left lateral flexion 25  ?Right rotation 53  ?Left rotation 50  ? Pt reports neck muscle tightness with all cervical motions ?   ?UE ROM: ?                      UE AROMs are grossly WNLs ?  ?  ?UE MMT: ?                      UE myotome screen is Neg ?  ?CERVICAL SPECIAL TESTS:  ?Spurling's test: Negative and Suboccipital release distraction decreased pt's HA  ?  ?  ?TODAY'S TREATMENT:  ?[facility]                                                DATE: 04/02/22 ?Manual Therapy: ?STM to the bilat suboccipitals, cervical paraspinals, upper traps, and temporalis ? ?Trigger Point Dry Needling Treatment: ?Skilled palpation for the Identification of taut muscles and TPs ?Pre-treatment instruction: Patient instructed on dry needling rationale, procedures, and possible side effects including pain during treatment (achy,cramping feeling), bruising, drop of blood, lightheadedness, nausea, sweating. ?Patient Consent Given: Yes ?Education handout provided: Previously provided ?Muscles treated: Bilat temporalis ?Needle size and number: .30x5030mm 2 ?Electrical stimulation performed: No ?Parameters: N/A ?Treatment response/outcome: Sharp prick and deep  ache ?Post-treatment instructions: Patient instructed to expect possible mild to moderate muscle soreness later today and/or tomorrow. Patient instructed in methods to reduce muscle soreness and to continue prescribed HEP. If patient was dry needled over the lung field, patient was instructed on signs and symptoms of pneumothorax and, however unlikely, to see immediate medical attention should they occur. Patient was also educated on signs and symptoms of infection and to seek medical attention should they occur. Patient verbalized understanding of these instructions and education. ? ? ? ?[facility] Adult PT Treatment:                                                DATE: 03/26/22 ?Therapeutic Exercise: ?Cervical Retraction  5 reps - 3 hold ?Cervical Retraction with Sidebending  3 reps - 3 hold ?Cervical Retraction and Rotation  3 reps - 3 hold ?Manual Therapy: ?STM to the bilat suboccipitals, cervical  paraspinals, upper traps, and ant. temporalis ? ?Trigger Point Dry Needling Treatment: ?Skilled palpation for the Identification of taut muscles and TPs ?Pre-treatment instruction: Patient instructed on dry needling rationale, procedures, and possible side effects including pain during treatment (achy,cramping feeling), bruising, drop of blood, lightheadedness, nausea, sweating. ?Patient Consent Given: Yes ?Education handout provided: Previously provided ?Muscles treated: Bilat suboccipials and splenius capitus ?Needle size and number:  .30x57mm 2 ?Electrical stimulation performed: No ?Parameters: N/A ?Treatment response/outcome: sharp prick and deep ache ?Post-treatment instructions: Patient instructed to expect possible mild to moderate muscle soreness later today and/or tomorrow. Patient instructed in methods to reduce muscle soreness and to continue prescribed HEP. If patient was dry needled over the lung field, patient was instructed on signs and symptoms of pneumothorax and, however unlikely, to see immediate medical attention should they occur. Patient was also educated on signs and symptoms of infection and to seek medical attention should they occur. Patient verbalized understanding of these instructions and education ? ? ?03/05/2022 ?- Seated Passive Cervical Retraction  5 reps - 3 hold ?- Supine Cervical Retraction with Towel  10 reps - 3 hold ?- Standing Cervical Retraction with Sidebending  2 reps - 3 hold ?- Seated Cervical Retraction and Rotation  2 reps - 3 hold ?- Seated Upper Trapezius Stretch  2 reps - 15 hold ?  ?  ?PATIENT EDUCATION:  ?Education details: Eval findings, POC, HEP ?Person educated: Patient ?Education method: Explanation, Demonstration, Tactile cues, Verbal cues, and Handouts ?Education comprehension: verbalized understanding, returned demonstration, verbal cues required, and tactile cues required ?  ?  ?HOME EXERCISE PROGRAM: ?Access Code: [email or web address] ?URL:  [email or web address] ?Date: 03/05/2022 ?Prepared by: Joellyn Rued ?  ?Exercises ?- Seated Passive Cervical Retraction  - 3 x daily - 7 x weekly - 3 sets - 3-10 reps - 3 hold ?- Supine Cervical Retraction with Towel  - 3 x daily - 7 x weekly - 3 sets - 3-10 reps - 3 hold ?- Standing Cervical Retraction with Sidebending  - 3 x daily - 7 x weekly - 3 sets - 3-10 reps - 3 hold ?- Seated Cervical Retraction and Rotation  - 3 x daily - 7 x weekly - 3 sets - 3-10 reps - 3 hold ?- Seated Upper Trapezius Stretch  - 3 x daily - 7 x weekly - 3 sets - 3-10 reps - 15 hold ?  ?ASSESSMENT: ?  ?CLINICAL IMPRESSION: ?PT provided STM to the  cervical, upper shoulder and and temporalis muscles. TPDN was completed to the taut muscle bands of the temporalis muscles bilat.  Pt experienced a sharp prick and deep ache. Pt tolerated the session without adverse effects. Pt response to PT thus far has been minimal relief. Will reassess next visit for continuation vs. DC from PT services. ?   ?OBJECTIVE IMPAIRMENTS decreased ROM, decreased strength, increased muscle spasms, impaired flexibility, postural dysfunction, and pain.  ?  ?ACTIVITY LIMITATIONS driving and occupation.  ?  ?PERSONAL FACTORS Time since onset of injury/illness/exacerbation are also affecting patient's functional outcome.  ?  ?  ?REHAB POTENTIAL: Good ?  ?CLINICAL DECISION MAKING: Evolving/moderate complexity ?  ?EVALUATION COMPLEXITY: Moderate ?  ?  ?GOALS: ?  ?SHORT TERM GOALS=LTGs ?  ?  ?LONG TERM GOALS: Target date: 04/23/22 ?  ?Pt will report a 25% decrease in the incidents of HAs ?Baseline: Experiences Has 50% of the time  ?Goal status: INITIAL ?  ?2.  Pt's cervical ROMs will increase by 10d for improved neck function ?Baseline: See flow sheets ?Goal status: INITIAL ?  ?3.  Pt will be Ind in a final HEP to maintain achieved LOF and QOL ?Baseline: started on eval ?Goal status: INITIAL ?  ?4.  Pt will voice understanding of measures to assist in the  management of cervical pain and HAs ?Baseline:  ?Goal status: INITIAL ?  ?  ?PLAN: ?PT FREQUENCY: 1x/week ?  ?PT DURATION: 6 weeks ?  ?PLANNED INTERVENTIONS: Therapeutic exercises, Therapeutic activity, Patient/Family education, Dry Needl

## 2022-04-02 ENCOUNTER — Ambulatory Visit: Payer: Managed Care, Other (non HMO)

## 2022-04-02 DIAGNOSIS — M542 Cervicalgia: Secondary | ICD-10-CM

## 2022-04-02 DIAGNOSIS — R252 Cramp and spasm: Secondary | ICD-10-CM

## 2022-04-08 NOTE — Therapy (Signed)
?OUTPATIENT PHYSICAL THERAPY TREATMENT NOTE/Discharge ? ? ?Patient Name: Brenda Torres ?MRN: [id number] ?DOB:09/07/1971, 51 y.o., female ?Today's Date: 04/10/2022 ? ?PCP: Janie Morning, DO ?REFERRING PROVIDER: Genia Harold, MD ? ?END OF SESSION:  ? PT End of Session - 04/10/22 1850   ? ? Visit Number 5   ? Number of Visits 7   ? Date for PT Re-Evaluation 04/23/22   ? Authorization Type [employer]   ? Progress Note Due on Visit 10   ? PT Start Time 1320   ? PT Stop Time 1400   ? PT Time Calculation (min) 40 min   ? Activity Tolerance Patient tolerated treatment well   ? Behavior During Therapy [facility] for tasks assessed/performed   ? ?  ?  ? ?  ? ? ? ? ? ?Past Medical History:  ?Diagnosis Date  ? Headache   ? Migraine   ? ?History reviewed. No pertinent surgical history. ?There are no problems to display for this patient. ? ? ?REFERRING DIAG: Cervicalgia ? ?THERAPY DIAG:  ?Cervicalgia ? ?Cramp and spasm ? ? ?SUBJECTIVE:  ?Pt reports her HAs have not changed since the initiation of PT. Pt does report her neck pain has improved.  ? ?PAIN:  ?Are you having pain? Yes: NPRS scale: 0/10 ?Pain location: neck, ram's horn ?Pain description: ache ?Aggravating factors: No consitent ?Reiving factors: Cold/heat, massage with racketball ?Range 4-6/10 ? ?OBJECTIVE:  ?*Unless otherwise noted, objective information collected previously*  ?DIAGNOSTIC FINDINGS:  ?NA ?  ?COGNITION: ?Overall cognitive status: Within functional limits for tasks assessed ?  ?  ?SENSATION: ?WFL ?  ?POSTURE:  ?Forward head c CT step ?  ?PALPATION: ?TTP of the suboccipital, cervical paraspinals, and upper traps R >L   ?  ?CERVICAL ROM:  ?  ?Active ROM A/PROM (deg) ?03/05/2022 AROM ?04/09/22  ?Flexion 30 40  ?Extension 60 60  ?Right lateral flexion 30 34  ?Left lateral flexion 25 29  ?Right rotation 53 65  ?Left rotation 50 60  ? Pt reports neck muscle tightness with all cervical motions ?  ?UE ROM: ?                      UE AROMs are grossly WNLs ?  ?  ?UE MMT: ?                       UE myotome screen is Neg ?  ?CERVICAL SPECIAL TESTS:  ?Spurling's test: Negative and Suboccipital release distraction decreased pt's HA  ?  ?  ?TODAY'S TREATMENT:  ?[facility] Adult PT Treatment:                                                DATE: 04/09/22 ? ?Therapeutic Exercise: ?Cervical Retraction  5 reps - 3 hold ?Cervical Retraction with Side bending  3 reps - 3 hold ?Cervical Retraction and Rotation  3 reps - 3 hold ?Cervical side bending c C/R 3 reps - 10 sec ?Cervical rotation c C/R 3 reps - 10 sec ?HEP update ?Manual Therapy: ?STM and DTM to the cervical paraspinals, upper traps, and suboccipitals ?Suboccipital release ?Grade ll and lll UPAs to C2-C5 ?C/R ROM for cervical side bending L and R ? ?[facility] Adult PT Treatment:  DATE: 04/02/22 ?Manual Therapy: ?STM to the bilat suboccipitals, cervical paraspinals, upper traps, and temporalis ? ?Trigger Point Dry Needling Treatment: ?Skilled palpation for the Identification of taut muscles and TPs ?Pre-treatment instruction: Patient instructed on dry needling rationale, procedures, and possible side effects including pain during treatment (achy,cramping feeling), bruising, drop of blood, lightheadedness, nausea, sweating. ?Patient Consent Given: Yes ?Education handout provided: Previously provided ?Muscles treated: Bilat temporalis ?Needle size and number: .30x70m 2 ?Electrical stimulation performed: No ?Parameters: N/A ?Treatment response/outcome: Sharp prick and deep ache ?Post-treatment instructions: Patient instructed to expect possible mild to moderate muscle soreness later today and/or tomorrow. Patient instructed in methods to reduce muscle soreness and to continue prescribed HEP. If patient was dry needled over the lung field, patient was instructed on signs and symptoms of pneumothorax and, however unlikely, to see immediate medical attention should they occur. Patient was also educated on signs and  symptoms of infection and to seek medical attention should they occur. Patient verbalized understanding of these instructions and education. ? ? ? [facility]                                                [facility] ?Therapeutic Exercise: ?Cervical Retraction  5 reps - 3 hold ?Cervical Retraction with Sidebending  3 reps - 3 hold ?Cervical Retraction and Rotation  3 reps - 3 hold ?Manual Therapy: ?STM to the bilat suboccipitals, cervical paraspinals, upper traps, and ant. temporalis ? ?Trigger Point Dry Needling Treatment: ?Skilled palpation for the Identification of taut muscles and TPs ?Pre-treatment instruction: Patient instructed on dry needling rationale, procedures, and possible side effects including pain during treatment (achy,cramping feeling), bruising, drop of blood, lightheadedness, nausea, sweating. ?Patient Consent Given: Yes ?Education handout provided: Previously provided ?Muscles treated: Bilat suboccipials and splenius capitus ?Needle size and number:  .30x387m2 ?Electrical stimulation performed: No ?Parameters: N/A ?Treatment response/outcome: sharp prick and deep ache ?Post-treatment instructions: Patient instructed to expect possible mild to moderate muscle soreness later today and/or tomorrow. Patient instructed in methods to reduce muscle soreness and to continue prescribed HEP. If patient was dry needled over the lung field, patient was instructed on signs and symptoms of pneumothorax and, however unlikely, to see immediate medical attention should they occur. Patient was also educated on signs and symptoms of infection and to seek medical attention should they occur. Patient verbalized understanding of these instructions and education ? ? ?03/05/2022 ?- Seated Passive Cervical Retraction  5 reps - 3 hold ?- Supine Cervical Retraction with Towel  10 reps - 3 hold ?- Standing Cervical Retraction with Sidebending  2 reps - 3 hold ?- Seated Cervical Retraction and Rotation  2  reps - 3 hold ?- Seated Upper Trapezius Stretch  2 reps - 15 hold ?  ?  ?PATIENT EDUCATION:  ?Education details: Eval findings, POC, HEP ?Person educated: Patient ?Education method: Explanation, Demonstration, Tactile cues, Verbal cues, and Handouts ?Education comprehension: verbalized understanding, returned demonstration, verbal cues required, and tactile cues required ?  ?  ?HOME EXERCISE PROGRAM: ?Access Code: [email or web address]: [email or web address] ?Date: 03/05/2022 ?Prepared by: AlGar Ponto  ?Exercises ?- Seated Passive Cervical Retraction  - 3 x daily - 7 x weekly - 3 sets - 3-10 reps - 3 hold ?- Supine Cervical Retraction with Towel  - 3 x daily - 7 x weekly - 3 sets -  3-10 reps - 3 hold ?- Standing Cervical Retraction with Sidebending  - 3 x daily - 7 x weekly - 3 sets - 3-10 reps - 3 hold ?- Seated Cervical Retraction and Rotation  - 3 x daily - 7 x weekly - 3 sets - 3-10 reps - 3 hold ?- Seated Upper Trapezius Stretch  - 3 x daily - 7 x weekly - 3 sets - 3-10 reps - 15 hold ?  ?ASSESSMENT: ?  ?CLINICAL IMPRESSION: ?Pt completed her course of PT today. Pt's Ha'sdid not improve with PT intervention. Pt notes her neck pain is improved and she has noticed better cervical mobility with driving. Reassessment of cervical ROM found most motions improved. Pt is DCed from PT to a HEP to help maintain progress achieved re: neck pain and mobility.  ?   ?OBJECTIVE IMPAIRMENTS decreased ROM, decreased strength, increased muscle spasms, impaired flexibility, postural dysfunction, and pain.  ?  ?ACTIVITY LIMITATIONS driving and occupation.  ?  ?PERSONAL FACTORS Time since onset of injury/illness/exacerbation are also affecting patient's functional outcome.  ?  ?  ?REHAB POTENTIAL: Good ?  ?CLINICAL DECISION MAKING: Evolving/moderate complexity ?  ?EVALUATION COMPLEXITY: Moderate ?  ?  ?GOALS: ?  ?SHORT TERM GOALS=LTGs ?  ?  ?LONG TERM GOALS: Target date: 04/23/22 ?  ?Pt will report a 25% decrease in the  incidents of HAs ?Baseline: Experiences Has 50% of the time  ?Goal status: Not met ?  ?2.  Pt's cervical ROMs will increase by 10d for improved neck function ?Baseline: See flow sheets ?Goal status: Met for

## 2022-04-09 ENCOUNTER — Ambulatory Visit: Payer: Managed Care, Other (non HMO) | Attending: Psychiatry

## 2022-04-09 DIAGNOSIS — M542 Cervicalgia: Secondary | ICD-10-CM | POA: Insufficient documentation

## 2022-04-09 DIAGNOSIS — R252 Cramp and spasm: Secondary | ICD-10-CM | POA: Insufficient documentation

## 2022-04-14 ENCOUNTER — Encounter: Payer: Self-pay | Admitting: Psychiatry

## 2022-04-16 ENCOUNTER — Other Ambulatory Visit: Payer: Self-pay | Admitting: Psychiatry

## 2022-04-20 ENCOUNTER — Other Ambulatory Visit: Payer: Self-pay | Admitting: Psychiatry

## 2022-04-26 ENCOUNTER — Telehealth: Payer: Self-pay | Admitting: Psychiatry

## 2022-04-26 MED ORDER — RIZATRIPTAN BENZOATE 10 MG PO TABS: ORAL_TABLET | ORAL | 3 refills | Status: DC

## 2022-04-26 NOTE — Telephone Encounter (Signed)
Patient needs refill for Maxalt please call into [facility] in [place] and any question call patient on her cell. Thank you

## 2022-04-26 NOTE — Addendum Note (Signed)
Addended by: Ann Maki on: 04/26/2022 12:02 PM   Modules accepted: Orders

## 2022-04-26 NOTE — Telephone Encounter (Signed)
Attempted to send rx through electronic rx failed twice. Will have Dr. Delena Bali sign and then will fax to [facility].

## 2022-04-26 NOTE — Telephone Encounter (Signed)
Rx has been sent via fax to [facility] pharmacy in [place]. Confirmation received.

## 2022-04-29 NOTE — Telephone Encounter (Signed)
Received 1 200 unit vial of Botox from [facility]. ?

## 2022-04-30 ENCOUNTER — Ambulatory Visit: Payer: Managed Care, Other (non HMO) | Admitting: Psychiatry

## 2022-04-30 DIAGNOSIS — G43111 Migraine with aura, intractable, with status migrainosus: Secondary | ICD-10-CM | POA: Diagnosis not present

## 2022-04-30 MED ORDER — METHYLPREDNISOLONE 4 MG PO TBPK: ORAL_TABLET | ORAL | 0 refills | Status: DC

## 2022-04-30 MED ORDER — RIZATRIPTAN BENZOATE 10 MG PO TABS: ORAL_TABLET | ORAL | 11 refills | Status: DC

## 2022-04-30 NOTE — Progress Notes (Signed)
Botox- 200 units x 1 vial Lot: [id number] Expiration: 01/2025 NDC: 5176-1607-37  Bacteriostatic 0.9% Sodium Chloride- 36mL total Lot: [id number] Expiration: 07/07/2023 NDC: 4854-6270-35  Dx: G43.109 S/P OR B/B

## 2022-04-30 NOTE — Progress Notes (Signed)
[facility]  BOTULINUM TOXIN INJECTION PROCEDURE NOTE  Patient: Brenda Torres   MRN: [id number]  Indication: Chronic migraines   History: 51 year old female who follows in clinic for chronic migraines. Continues to have frequent headaches. She has had daily headaches for the past week.  Last injection: First injection today  Technique: Informed consent was obtained and signed. 200 units onabotulinumtoxinA (Lot#  [id number]; Expiration: 01/2025) were reconstituted using normal saline, to a concentration of 5 units per 0.57ml. 155 units were injected using sterile technique across 31 sites as follows: corrugator 10, procerus 5 units, frontalis 20 units, temporalis 40 units, occipitalis 30 units, cervical paraspinal 20 units, trapezius 30 units. 45 units were wasted. Patient tolerated procedure without complication.  Plan: -Continue Maxalt 10 mg PRN -Medrol dosepak to help break current headache cycle -Return for Botox in 3 months  Ocie Doyne 04/30/22 9:40 AM

## 2022-05-04 NOTE — Telephone Encounter (Signed)
Received signed patient consent (given to medical records) for Botox (Dx: G43.111).

## 2022-06-16 ENCOUNTER — Telehealth: Payer: Self-pay | Admitting: Psychiatry

## 2022-06-16 NOTE — Telephone Encounter (Signed)
Pt is asking for a call to r/s her Botox appointment °

## 2022-06-16 NOTE — Telephone Encounter (Signed)
Called patient got appointment rescheduled.

## 2022-06-18 ENCOUNTER — Other Ambulatory Visit: Payer: Self-pay | Admitting: Psychiatry

## 2022-07-21 NOTE — Progress Notes (Unsigned)
[facility]  BOTULINUM TOXIN INJECTION PROCEDURE NOTE  Patient: Brenda Torres   MRN: [id number]  Indication: Chronic migraines   History: 51 year old female who follows in clinic for chronic migraines. She has not noticed significant improvement since her first Botox session. Her neck and shoulders do feel less tense. Feels Maxalt has been less effective lately and is taking longer to work.  Last injection: 04/30/22, second injection today  Technique: Informed consent was obtained and signed. 200 units onabotulinumtoxinA (Lot# [id number]; Expiration: 2/26) were reconstituted using normal saline, to a concentration of 5 units per 0.25ml. 155 units were injected using sterile technique across 31 sites as follows: corrugator 10, procerus 5 units, frontalis 20 units, temporalis 40 units, occipitalis 30 units, cervical paraspinal 20 units, trapezius 30 units. 45 units were wasted. Patient tolerated procedure without complication.  Prior Therapies                                 Rescue: Maxalt  Imitrex Axert Zomig Frova Migranal Flurbiprofen Ketoprofen  Preventive: Topamax 175 mg QHS - lack of efficacy Atenolol - lack of efficacy Botox Magnesium B2  Plan: -MRI brain ordered for worsening headaches despite treatment -Continue Maxalt 10 mg PRN. Sample for [place] provided. Will send in rx if this is effective.  -Return for Botox in 3 months  Ocie Doyne 07/22/22 3:28 PM

## 2022-07-22 ENCOUNTER — Ambulatory Visit: Payer: Managed Care, Other (non HMO) | Admitting: Psychiatry

## 2022-07-22 ENCOUNTER — Telehealth: Payer: Self-pay | Admitting: Psychiatry

## 2022-07-22 VITALS — BP 130/72

## 2022-07-22 DIAGNOSIS — G43111 Migraine with aura, intractable, with status migrainosus: Secondary | ICD-10-CM | POA: Diagnosis not present

## 2022-07-22 DIAGNOSIS — G43109 Migraine with aura, not intractable, without status migrainosus: Secondary | ICD-10-CM

## 2022-07-22 DIAGNOSIS — R519 Headache, unspecified: Secondary | ICD-10-CM

## 2022-07-22 MED ORDER — ONABOTULINUMTOXINA 200 UNITS IJ SOLR
155.0000 [IU] | Freq: Once | INTRAMUSCULAR | Status: AC
  Administered 2022-07-22: 155 [IU] via INTRAMUSCULAR

## 2022-07-22 MED ORDER — LORAZEPAM 0.5 MG PO TABS
ORAL_TABLET | ORAL | 0 refills | Status: DC
Start: 2022-07-22 — End: 2023-08-25

## 2022-07-22 NOTE — Patient Instructions (Signed)
Try Bernita Raisin for migraine rescue. Take one pill at onset of migraine. May repeat a dose in 2 hours if migraine persists

## 2022-07-22 NOTE — Progress Notes (Signed)
Botox- 200 units x 1 vial Lot: [id number] Expiration: 2/26 NDC: 3545-6256-38   Bacteriostatic 0.9% Sodium Chloride- 102mL total Lot: [id number] Expiration: 07 Aug 2023 NDC: 8768-1157-26   Dx: G43.109 SP

## 2022-07-22 NOTE — Telephone Encounter (Signed)
[employer] sent to [facility] they obtain auth  

## 2022-07-26 ENCOUNTER — Ambulatory Visit: Payer: Managed Care, Other (non HMO) | Admitting: Psychiatry

## 2022-07-27 ENCOUNTER — Ambulatory Visit: Payer: Managed Care, Other (non HMO) | Admitting: Psychiatry

## 2022-08-19 ENCOUNTER — Telehealth: Payer: Self-pay | Admitting: Psychiatry

## 2022-08-19 MED ORDER — UBRELVY 100 MG PO TABS: 100.0000 mg | ORAL_TABLET | ORAL | 6 refills | Status: DC | PRN

## 2022-08-19 NOTE — Telephone Encounter (Signed)
Rx sent, thanks 

## 2022-08-19 NOTE — Telephone Encounter (Signed)
Pt would like to have Ubrelvy called into [id number] -

## 2022-08-19 NOTE — Addendum Note (Signed)
Addended by: Ocie Doyne on: 08/19/2022 03:55 PM   Modules accepted: Orders

## 2022-08-25 ENCOUNTER — Telehealth: Payer: Self-pay

## 2022-08-25 NOTE — Telephone Encounter (Signed)
PA for Brenda Torres has been sent to plan via CMM. Awaiting determination from [facility]. Key: [id number]

## 2022-08-27 ENCOUNTER — Other Ambulatory Visit: Payer: 59

## 2022-08-31 ENCOUNTER — Encounter: Payer: Self-pay | Admitting: *Deleted

## 2022-08-31 NOTE — Telephone Encounter (Signed)
Roselyn Meier [id number], Approved ;Coverage Start Date:08/01/2022;Coverage End Date:08/31/2023. Sent patient my chart.

## 2022-09-10 ENCOUNTER — Ambulatory Visit
Admission: RE | Admit: 2022-09-10 | Discharge: 2022-09-10 | Disposition: A | Discharge status: Home / Self Care | Payer: Managed Care, Other (non HMO) | Source: Ambulatory Visit | Attending: Psychiatry | Admitting: Psychiatry

## 2022-09-10 DIAGNOSIS — R519 Headache, unspecified: Secondary | ICD-10-CM

## 2022-09-10 MED ORDER — GADOBENATE DIMEGLUMINE 529 MG/ML IV SOLN
15.0000 mL | Freq: Once | INTRAVENOUS | Status: AC | PRN
  Administered 2022-09-10: 15 mL via INTRAVENOUS

## 2022-10-21 ENCOUNTER — Ambulatory Visit: Payer: Managed Care, Other (non HMO) | Admitting: Psychiatry

## 2022-10-21 VITALS — BP 127/84 | HR 103

## 2022-10-21 DIAGNOSIS — G43719 Chronic migraine without aura, intractable, without status migrainosus: Secondary | ICD-10-CM | POA: Diagnosis not present

## 2022-10-21 MED ORDER — NURTEC 75 MG PO TBDP: 75.0000 mg | ORAL_TABLET | ORAL | 0 refills | Status: DC | PRN

## 2022-10-21 MED ORDER — ONABOTULINUMTOXINA 200 UNITS IJ SOLR
155.0000 [IU] | Freq: Once | INTRAMUSCULAR | Status: AC
  Administered 2022-10-21: 155 [IU] via INTRAMUSCULAR

## 2022-10-21 NOTE — Progress Notes (Signed)
Botox- 200 units x 1 vial Lot: [id number] Expiration: 03/2025 NDC: 2563-8937-34   Bacteriostatic 0.9% Sodium Chloride- 59mL total Lot: [id number] Expiration: 11/06/22 NDC: 1572-6203-55   Dx: H74.163 S/P

## 2022-10-21 NOTE — Progress Notes (Signed)
[facility]  BOTULINUM TOXIN INJECTION PROCEDURE NOTE  Patient: Dacie Mandel   MRN: [id number]  Indication: Chronic migraines   History: 51 year old female who follows in clinic for chronic migraines. She has not noticed much of a difference in her migraines since her second Botox injection. Continues to have headaches about 30% of the time. Bernita Raisin worked about the same as Scientist, clinical (histocompatibility and immunogenetics). Both are inconsistently effective.  Last injection: 07/22/22 (third injection today)  Technique: Informed consent was obtained and signed. 200 units onabotulinumtoxinA (Lot# [id number]; Expiration: 03/2025) were reconstituted using normal saline, to a concentration of 5 units per 0.68ml. 155 units were injected using sterile technique across 31 sites as follows: corrugator 10, procerus 5 units, frontalis 20 units, temporalis 40 units, occipitalis 30 units, cervical paraspinal 20 units, trapezius 30 units. 45 units were wasted. Patient tolerated procedure without complication.  Prior Therapies                                 Rescue: Maxalt 10 mg PRN Imitrex Axert Zomig Frova Migranal Flurbiprofen Ketoprofen Ubrelvy 100 mg PRN   Preventive: Topamax 175 mg QHS - lack of efficacy Atenolol - lack of efficacy Botox Magnesium B2  Plan: -Rescue: Continue Ubrelvy for rescue. Nurtec sample provided, will send in rx if this is effective -Prevention: Will plan to continue Botox if she has improvement after this session. If no improvement, consider CGRP for prevention  Ocie Doyne 10/21/22 2:27 PM

## 2022-11-15 ENCOUNTER — Encounter (INDEPENDENT_AMBULATORY_CARE_PROVIDER_SITE_OTHER): Payer: Managed Care, Other (non HMO) | Admitting: Psychiatry

## 2022-11-15 DIAGNOSIS — G43719 Chronic migraine without aura, intractable, without status migrainosus: Secondary | ICD-10-CM

## 2022-11-16 ENCOUNTER — Other Ambulatory Visit: Payer: Self-pay | Admitting: Psychiatry

## 2022-11-16 MED ORDER — EMGALITY 120 MG/ML ~~LOC~~ SOAJ: 240.0000 mg | Freq: Once | SUBCUTANEOUS | 0 refills | Status: AC

## 2022-11-16 MED ORDER — EMGALITY 120 MG/ML ~~LOC~~ SOAJ: 120.0000 mg | SUBCUTANEOUS | 5 refills | Status: DC

## 2022-11-16 NOTE — Telephone Encounter (Signed)
During botox visit you noted If no improvement, consider CGRP for prevention. Which injection would you like to try?

## 2022-11-16 NOTE — Telephone Encounter (Signed)
Please see the MyChart message reply(ies) for my assessment and plan.    This patient gave consent for this Medical Advice Message and is aware that it may result in a bill to [employer], as well as the possibility of receiving a bill for a co-payment or deductible. They are an established patient, but are not seeking medical advice exclusively about a problem treated during an in person or video visit in the last seven days. I did not recommend an in person or video visit within seven days of my reply.    I spent a total of 5 minutes cumulative time within 7 days through [employer].  Ocie Doyne, MD  11/16/22 12:02 PM

## 2022-11-18 ENCOUNTER — Telehealth: Payer: Self-pay | Admitting: Neurology

## 2022-11-18 NOTE — Telephone Encounter (Signed)
PA submitted for the pt on CMM/ [employer] [facility] KEY:[id number] Approved immediately   CaseId:[id number];Status:Approved;Review Type:Prior Auth;Coverage Start Date:10/19/2022;Coverage End Date:11/18/2023;

## 2022-12-14 ENCOUNTER — Telehealth: Payer: Self-pay | Admitting: Psychiatry

## 2022-12-14 NOTE — Telephone Encounter (Signed)
Called the patient's pharmacy and on 12/12  the loading dose for emgality was sent for the patient to take the 240 mg for the first month. The pt and pharmacist was confused. They advised they will correct that for the patient.

## 2022-12-14 NOTE — Telephone Encounter (Signed)
Pt called stating that she was to get the Galcanezumab-gnlm (EMGALITY) 120 MG/ML SOAJ starter pack with two injections but the pharmacy states they do not have the 2 pack. Please advise.

## 2022-12-15 NOTE — Telephone Encounter (Signed)
Received another PA request for the patient on [employer] [facility]  KEY: [id number] Will await determination

## 2023-01-12 NOTE — Progress Notes (Unsigned)
   CC:  headaches  Follow-up Visit  Last visit: 10/21/22 (for Botox)  Brief HPI:  52 year old female who follows in clinic for chronic migraines.   At her last visit she was given a Nurtec sample to try for rescue.  Interval History: She did not notice improvement in her headaches after 3 sessions of Botox. She was started on Emgality last month and does feel like it has been helping. She has had 4 migraines this month compared to 10 migraines last month. She was unable to tell if Nurtec sample was helpful or not and would like to try a prescription of it.   Migraine days per month: 4 Headache free days per month: 26  Current Headache Regimen: Preventative: Emgality 120 mg monthly Abortive: Maxalt 10 mg PRN, Ubrelvy 100 mg PRN   Prior Therapies                                                               Rescue: Maxalt 10 mg PRN Imitrex Axert Zomig Frova Migranal Flurbiprofen Ketoprofen Ubrelvy 100 mg PRN   Preventive: Topamax 175 mg QHS - lack of efficacy Atenolol - lack of efficacy Emgality 120 mg monthly Botox - lack of efficacy Magnesium B2  Physical Exam:   Vital Signs: BP (!) 126/91   Pulse 85   Ht 5\' 8"  (1.727 m)   Wt 169 lb 8 oz (76.9 kg)   BMI 25.77 kg/m  GENERAL:  well appearing, in no acute distress, alert  SKIN:  Color, texture, turgor normal. No rashes or lesions HEAD:  Normocephalic/atraumatic. RESP: normal respiratory effort MSK:  No gross joint deformities.   NEUROLOGICAL: Mental Status: Alert, oriented to person, place and time, Follows commands, and Speech fluent and appropriate. Cranial Nerves: PERRL, face symmetric, no dysarthria, hearing grossly intact Motor: moves all extremities equally Gait: normal-based.  IMPRESSION: 52 year old female who presents for follow up of chronic migraines. She has noticed improvement in her migraines since starting Emgality. She would like to try a prescription of Nurtec for rescue. She would also  like to try Maxalt ODT as she found the dissolvable pills more convenient to take.  PLAN: -Prevention: Continue Emgality 120 mg monthly -Rescue: Start Nurtec 75 mg PRN, Continue Maxalt 10 mg ODT -Next steps: consider Ajovy/Aimovig or Qulipta for prevention   Follow-up: 3 months  I spent a total of 22 minutes on the date of the service. Headache education was done. Discussed treatment options including preventive and acute medications. Discussed medication side effects, adverse reactions and drug interactions. Written educational materials and patient instructions outlining all of the above were given.  Genia Harold, MD 01/13/23 3:01 PM

## 2023-01-13 ENCOUNTER — Ambulatory Visit: Payer: Managed Care, Other (non HMO) | Admitting: Psychiatry

## 2023-01-13 ENCOUNTER — Encounter: Payer: Self-pay | Admitting: Psychiatry

## 2023-01-13 ENCOUNTER — Telehealth: Payer: Self-pay | Admitting: Psychiatry

## 2023-01-13 VITALS — BP 126/91 | HR 85 | Ht 68.0 in | Wt 169.5 lb

## 2023-01-13 DIAGNOSIS — G43111 Migraine with aura, intractable, with status migrainosus: Secondary | ICD-10-CM | POA: Diagnosis not present

## 2023-01-13 MED ORDER — RIZATRIPTAN BENZOATE 10 MG PO TBDP: 10.0000 mg | ORAL_TABLET | ORAL | 11 refills | Status: DC | PRN

## 2023-01-13 NOTE — Telephone Encounter (Signed)
Per Pod 1, pt arrived to appointment today and stated that she did not wish to have Botox. She claimed to have called someone to inform them of this, but there is no documentation of this in her chart. I reached out to [facility] to see if they obtained consent to ship from the patient (we received Botox from them on 12/30/22). Per [facility] rep Reinaldo Berber, pt provided lifetime consent for this medication on 07/15/2022, so they would not reach out to her for every shipment. They also have no documentation of her reaching out to them to cancel Botox. Reinaldo Berber states the pt's estimated copay to be $0.

## 2023-01-17 ENCOUNTER — Other Ambulatory Visit: Payer: Self-pay | Admitting: Psychiatry

## 2023-01-17 MED ORDER — NURTEC 75 MG PO TBDP: 75.0000 mg | ORAL_TABLET | ORAL | 6 refills | Status: DC | PRN

## 2023-01-17 NOTE — Telephone Encounter (Signed)
I called [facility] at [phone]. Spoke w/ Philbert. Cx rx botox on file, advised pt no longer on this therapy.

## 2023-02-14 ENCOUNTER — Other Ambulatory Visit (HOSPITAL_COMMUNITY): Payer: Self-pay

## 2023-04-07 ENCOUNTER — Ambulatory Visit: Payer: Managed Care, Other (non HMO) | Admitting: Adult Health

## 2023-04-07 ENCOUNTER — Encounter: Payer: Self-pay | Admitting: Adult Health

## 2023-04-07 VITALS — BP 100/72 | HR 83 | Ht 68.0 in | Wt 163.0 lb

## 2023-04-07 DIAGNOSIS — G43111 Migraine with aura, intractable, with status migrainosus: Secondary | ICD-10-CM | POA: Diagnosis not present

## 2023-04-07 MED ORDER — NURTEC 75 MG PO TBDP: 75.0000 mg | ORAL_TABLET | Freq: Every day | ORAL | 0 refills | Status: DC | PRN

## 2023-04-07 NOTE — Progress Notes (Signed)
PATIENT: Brenda Torres DOB: [birth year]  REASON FOR VISIT: follow up HISTORY FROM: patient PRIMARY NEUROLOGIST: Dr. Delena Bali  HISTORY OF PRESENT ILLNESS: Today 04/07/23  Brenda Torres is a 52 y.o. female who has been followed in this office for migraine headaches. Returns today for follow-up.  Remains on Emgality.  She states that the first time she took the injection she only had 4 migraines.  The next month was 3 migraines.  Third months she increased to 6 migraines.  She states that this last months she has had 8 migraines.  She states that the severity of her migraines have improved with Emgality.  Headache is stable today located across the forehead.  She typically does not have light or noise sensitivity.  Also denies nausea or vomiting.  She typically takes [place] or Maxalt and the headache will resolve within an hour.  On a rare occasion she may need a second tablet.  Nurtec was prescribed at the last visit with Dr. Delena Bali but she has not received this yet.   HISTORY   01/13/23 (copied from Dr. Quentin Mulling note)   Brief HPI:  52 year old female who follows in clinic for chronic migraines.    At her last visit she was given a Nurtec sample to try for rescue.   Interval History: She did not notice improvement in her headaches after 3 sessions of Botox. She was started on Emgality last month and does feel like it has been helping. She has had 4 migraines this month compared to 10 migraines last month. She was unable to tell if Nurtec sample was helpful or not and would like to try a prescription of it.     Migraine days per month: 4 Headache free days per month: 26   Current Headache Regimen: Preventative: Emgality 120 mg monthly Abortive: Maxalt 10 mg PRN, Ubrelvy 100 mg PRN     Prior Therapies                                                               Rescue: Maxalt 10 mg PRN Imitrex Axert Zomig Frova Migranal Flurbiprofen Ketoprofen Ubrelvy 100 mg PRN    Preventive: Topamax 175 mg QHS - lack of efficacy Atenolol - lack of efficacy Emgality 120 mg monthly Botox - lack of efficacy Magnesium B2  REVIEW OF SYSTEMS: Out of a complete 14 system review of symptoms, the patient complains only of the following symptoms, and all other reviewed systems are negative.  ALLERGIES: Allergies  Allergen Reactions   Codeine Nausea Only    In college     HOME MEDICATIONS: Outpatient Medications Prior to Visit  Medication Sig Dispense Refill   cetirizine (ZYRTEC) 10 MG tablet Zyrtec 10 mg tablet  Take 1 tablet every day by oral route.     Cholecalciferol (VITAMIN D3 PO) Take by mouth.     clindamycin-benzoyl peroxide (BENZACLIN) gel Apply topically 2 (two) times daily.     Coenzyme Q10-Vitamin E (QUNOL ULTRA COQ10 PO) Take by mouth.     Galcanezumab-gnlm (EMGALITY) 120 MG/ML SOAJ Inject 120 mg into the skin every 30 (thirty) days. 1.12 mL 5   levocetirizine (XYZAL) 5 MG tablet Take 5 mg by mouth every evening.     Multiple Vitamin (MULTIVITAMIN ADULT PO) multivitamin  Multiple Vitamin (MULTIVITAMIN) capsule Take 1 capsule by mouth daily.     norethindrone-ethinyl estradiol-FE (LOESTRIN FE) 1-20 MG-MCG tablet Take 1 tablet by mouth daily.     Omega-3 Fatty Acids (FISH OIL PO) Take by mouth.     Riboflavin (VITAMIN B2 PO) Take by mouth.     rizatriptan (MAXALT-MLT) 10 MG disintegrating tablet Take 1 tablet (10 mg total) by mouth as needed for migraine. May repeat in 2 hours if needed 9 tablet 11   spironolactone (ALDACTONE) 25 MG tablet spironolactone 25 mg tablet     tazarotene (AVAGE) 0.1 % cream Tazorac 0.1 % topical cream  APPLY TO THE AFFECTED AREA(S) BY TOPICAL ROUTE ONCE DAILY     Triamcinolone Acetonide (NASACORT ALLERGY 24HR NA) Place into the nose.     Turmeric (QC TUMERIC COMPLEX PO) Take by mouth.     LORazepam (ATIVAN) 0.5 MG tablet Take 1-2 pills 30 minutes before MRI (Patient not taking: Reported on 04/07/2023) 2 tablet 0    methylPREDNISolone (MEDROL DOSEPAK) 4 MG TBPK tablet Take as directed by packaging (Patient not taking: Reported on 04/07/2023) 1 each 0   Rimegepant Sulfate (NURTEC) 75 MG TBDP Take 1 tablet (75 mg total) by mouth as needed. (Patient not taking: Reported on 04/07/2023) 8 tablet 6   No facility-administered medications prior to visit.    PAST MEDICAL HISTORY: Past Medical History:  Diagnosis Date   Headache    Migraine     PAST SURGICAL HISTORY: History reviewed. No pertinent surgical history.  FAMILY HISTORY: Family History  Problem Relation Age of Onset   Hypertension Mother    Hypertension Father     SOCIAL HISTORY: Social History   Socioeconomic History   Marital status: Married    Spouse name: Not on file   Number of children: 0   Years of education: Not on file   Highest education level: Not on file  Occupational History   Occupation: Scientist, water quality  Tobacco Use   Smoking status: Never   Smokeless tobacco: Never  Vaping Use   Vaping Use: Never used  Substance and Sexual Activity   Alcohol use: Yes    Alcohol/week: 7.0 - 14.0 standard drinks of alcohol    Types: 7 - 14 Standard drinks or equivalent per week    Comment: 1-2/day   Drug use: Never   Sexual activity: Not on file  Other Topics Concern   Not on file  Social History Narrative   Right handed   Caffeine- 1-2 cups per day    Social Determinants of Health   Financial Resource Strain: Not on file  Food Insecurity: Not on file  Transportation Needs: Not on file  Physical Activity: Not on file  Stress: Not on file  Social Connections: Not on file  Intimate Partner Violence: Not on file      PHYSICAL EXAM  Vitals:   04/07/23 1509  BP: 100/72  Pulse: 83  SpO2: 99%  Weight: 163 lb (73.9 kg)  Height: 5\' 8"  (1.727 m)   Body mass index is 24.78 kg/m.  Generalized: Well developed, in no acute distress   Neurological examination  Mentation: Alert oriented to time, place, history taking.  Follows all commands speech and language fluent Cranial nerve II-XII: Pupils were equal round reactive to light. Extraocular movements were full, visual field were full on confrontational test. Facial sensation and strength were normal. Uvula tongue midline. Head turning and shoulder shrug  were normal and symmetric. Motor: The motor testing reveals  5 over 5 strength of all 4 extremities. Good symmetric motor tone is noted throughout.  Sensory: Sensory testing is intact to soft touch on all 4 extremities. No evidence of extinction is noted.  Coordination: Cerebellar testing reveals good finger-nose-finger and heel-to-shin bilaterally.  Gait and station: Gait is normal.   Reflexes: Deep tendon reflexes are symmetric and normal bilaterally.   DIAGNOSTIC DATA (LABS, IMAGING, TESTING) - I reviewed patient records, labs, notes, testing and imaging myself where available.    ASSESSMENT AND PLAN 52 y.o. year old female  has a past medical history of Headache and Migraine. here with:  Migraine headache  Continue Emgality monthly injection Continue Ubrelvy or Maxalt for abortive therapy Nurtec samples was given to the patient to try.  If she finds this more beneficial we can send in prescription Did advise the patient if her headache frequency continues to increase we can consider switching her to [place] or [place]. Follow-up in 6 months or sooner if needed     Butch Penny, MSN, NP-C 04/07/2023, 3:09 PM [facility] [address], [address] [place], [place] [address] [phone] '

## 2023-04-28 ENCOUNTER — Telehealth: Payer: Self-pay

## 2023-04-28 ENCOUNTER — Other Ambulatory Visit (HOSPITAL_COMMUNITY): Payer: Self-pay

## 2023-04-28 NOTE — Telephone Encounter (Signed)
Pharmacy Patient Advocate Encounter  Prior Authorization for Nurtec 75MG  dispersible tablets has been approved by [employer] (ins).    PA # PA Case ID: [id number]  Effective dates: 03/29/2023 through 04/27/2024  Copay is $0

## 2023-06-23 ENCOUNTER — Encounter: Payer: Self-pay | Admitting: Adult Health

## 2023-06-23 NOTE — Telephone Encounter (Signed)
Last visit 04/07/23

## 2023-06-27 ENCOUNTER — Other Ambulatory Visit: Payer: Self-pay | Admitting: Psychiatry

## 2023-06-27 MED ORDER — QULIPTA 30 MG PO TABS: 30.0000 mg | ORAL_TABLET | Freq: Every day | ORAL | 6 refills | Status: DC

## 2023-06-27 NOTE — Telephone Encounter (Signed)
Since she was interested in trying the pills, I sent a prescription for Bennie Pierini to her pharmacy. We can try this for prevention in place of Emgality

## 2023-07-05 ENCOUNTER — Other Ambulatory Visit (HOSPITAL_COMMUNITY): Payer: Self-pay

## 2023-07-05 ENCOUNTER — Telehealth: Payer: Self-pay

## 2023-07-05 NOTE — Telephone Encounter (Signed)
Pharmacy Patient Advocate Encounter  Received notification from [employer] that Prior Authorization for Qulipta 30MG  tablets has been APPROVED from 06/05/2023 to 07/04/2024. Ran test claim, Copay is $0 per 30DS  PA #/Case ID/Reference #: PA Case ID: [id number]    Key: [id number]

## 2023-08-25 ENCOUNTER — Ambulatory Visit: Payer: Managed Care, Other (non HMO) | Admitting: Psychiatry

## 2023-08-25 ENCOUNTER — Encounter: Payer: Self-pay | Admitting: Psychiatry

## 2023-08-25 VITALS — BP 131/94 | HR 81 | Ht 68.0 in | Wt 161.0 lb

## 2023-08-25 DIAGNOSIS — G43119 Migraine with aura, intractable, without status migrainosus: Secondary | ICD-10-CM | POA: Diagnosis not present

## 2023-08-25 MED ORDER — ZOLMITRIPTAN 5 MG NA SOLN: 1.0000 | NASAL | 6 refills | Status: AC | PRN

## 2023-08-25 MED ORDER — QULIPTA 60 MG PO TABS: 60.0000 mg | ORAL_TABLET | Freq: Every day | ORAL | 6 refills | Status: DC

## 2023-08-25 MED ORDER — NURTEC 75 MG PO TBDP: 75.0000 mg | ORAL_TABLET | ORAL | 6 refills | Status: DC | PRN

## 2023-08-25 NOTE — Patient Instructions (Signed)
MIND Diet: The Mediterranean-DASH Diet Intervention for Neurodegenerative Delay, or MIND diet, targets the health of the aging brain. Research participants with the highest MIND diet scores had a significantly slower rate of cognitive decline compared with those with the lowest scores. The effects of the MIND diet on cognition showed greater effects than either the Mediterranean or the DASH diet alone.  The healthy items the MIND diet guidelines suggest include:  3+ servings a day of whole grains 1+ servings a day of vegetables (other than green leafy) 6+ servings a week of green leafy vegetables 5+ servings a week of nuts 4+ meals a week of beans 2+ servings a week of berries 2+ meals a week of poultry 1+ meals a week of fish Mainly olive oil if added fat is used  The unhealthy items, which are higher in saturated and trans fat, include: Less than 5 servings a week of pastries and sweets Less than 4 servings a week of red meat (including beef, pork, lamb, and products made from these meats) Less than one serving a week of cheese and fried foods Less than 1 tablespoon a day of butter/stick margarine

## 2023-08-25 NOTE — Progress Notes (Signed)
   CC:  headaches  Follow-up Visit  Last visit: 04/07/23  Brief HPI: 52 year old female who follows in clinic for chronic migraines. MRI brain 09/10/22 was normal.  At her last visit she was continued on Emgality for prevention. Nurtec was prescribed for rescue. Interval History: She continued to have up to 9 migraine days per month on Emgality, so she was switched to [place] for prevention 1.5 months ago. Headaches have been about the same on Qulipta as they were on Emgality. She is tolerating it well without side effects. Likes that she does not have to refrigerate it like she did with Emgality.  Nurtec helps relieve her headache but can take several hours to start working.  Migraine days per month: 9 Headache free days per month: 21  Current Headache Regimen: Preventative: Qulipta 30 mg daily Abortive: Nurtec 75 mg PRN   Prior Therapies                                  Rescue: Maxalt 10 mg PRN Imitrex Axert Zomig pill Frova Migranal Flurbiprofen Ketoprofen Ubrelvy 100 mg PRN Nurtec 75 mg PRN   Preventive: Topamax 175 mg QHS - lack of efficacy Atenolol - lack of efficacy Emgality 120 mg monthly Botox - lack of efficacy Qulipta 30 mg daily Magnesium B2  Physical Exam:   Vital Signs: Ht 5\' 8"  (1.727 m)   Wt 161 lb (73 kg)   BMI 24.48 kg/m  GENERAL:  well appearing, in no acute distress, alert  SKIN:  Color, texture, turgor normal. No rashes or lesions HEAD:  Normocephalic/atraumatic. RESP: normal respiratory effort  NEUROLOGICAL: Mental Status: Alert, oriented to person, place and time, Follows commands, and Speech fluent and appropriate. Cranial Nerves: PERRL, face symmetric, no dysarthria, hearing grossly intact Motor: moves all extremities equally Gait: normal-based.  IMPRESSION: 52 year old female who presents for follow up of chronic migraines. She continues to have multiple migraines per month on Qulipta 30 mg daily. Will increase to 60 mg daily.  Nurtec helps reduce her headache but takes several hours to start working. Will try Zomig nasal spray for rescue.  PLAN: -Prevention: Increase Qulipta to 60 mg daily -Rescue: Continue Nurtec for now. Start Zomig nasal spray for rescue -Next steps: consider Zavzpret for rescue, consider Aimovig/Ajovy or Vyepti for prevention   Follow-up: 6 months  I spent a total of 25 minutes on the date of the service. Headache education was done. Discussed treatment options including preventive and acute medications. Discussed medication side effects, adverse reactions and drug interactions. Written educational materials and patient instructions outlining all of the above were given.  Ocie Doyne, MD 08/25/23 1:59 PM

## 2023-09-19 ENCOUNTER — Telehealth: Payer: Self-pay

## 2023-09-19 ENCOUNTER — Other Ambulatory Visit (HOSPITAL_COMMUNITY): Payer: Self-pay

## 2023-09-19 NOTE — Telephone Encounter (Signed)
*  [facility]  Pharmacy Patient Advocate Encounter  Received notification from [employer] that Prior Authorization for ZOLMitriptan 5MG  solution  has been APPROVED from 09/19/2023 to 09/17/2024. Ran test claim, Copay is $10.00. This test claim was processed through [facility]- copay amounts may vary at other pharmacies due to pharmacy/plan contracts, or as the patient moves through the different stages of their insurance plan.   PA #/Case ID/Reference #: [id number]

## 2023-10-26 ENCOUNTER — Ambulatory Visit: Payer: Managed Care, Other (non HMO) | Admitting: Psychiatry

## 2024-02-28 ENCOUNTER — Encounter: Payer: Self-pay | Admitting: Neurology

## 2024-02-28 ENCOUNTER — Ambulatory Visit: Payer: Managed Care, Other (non HMO) | Admitting: Neurology

## 2024-02-28 VITALS — BP 106/71 | HR 81 | Ht 68.0 in | Wt 152.6 lb

## 2024-02-28 DIAGNOSIS — G43009 Migraine without aura, not intractable, without status migrainosus: Secondary | ICD-10-CM

## 2024-02-28 MED ORDER — ONDANSETRON 4 MG PO TBDP: 4.0000 mg | ORAL_TABLET | Freq: Three times a day (TID) | ORAL | 11 refills | Status: AC | PRN

## 2024-02-28 MED ORDER — RIZATRIPTAN BENZOATE 10 MG PO TBDP: 10.0000 mg | ORAL_TABLET | ORAL | 11 refills | Status: AC | PRN

## 2024-02-28 MED ORDER — NURTEC 75 MG PO TBDP: 75.0000 mg | ORAL_TABLET | ORAL | 11 refills | Status: AC | PRN

## 2024-02-28 MED ORDER — QULIPTA 60 MG PO TABS: 60.0000 mg | ORAL_TABLET | Freq: Every day | ORAL | 11 refills | Status: AC

## 2024-02-28 NOTE — Progress Notes (Signed)
 CC:  headaches  Follow-up Visit  3/25//2025: She is on the qulipta. She has 4-6 migraine days a month but <8 total headache days a month. Nurtec is "hit and miss". She may take it before bed and she still has the headache in the morning but with the triptan it will work better. We discussed acute management strategies: Take the triptan and nurtec and even ibuprofen/tylenol etc right at onset and ondansetron (if needed for nausea). Optimize acute management since qulipta is working well. Another options again is to try possibly instead of qulipta these newer preventatives Vyepti, Ajovy or Aimovig BUT if the qulipta is working best than any other than any other med would stay on it and optimize acute management Other thought: If you still have 8 migraine days a month and >15 total headahe days a month can layer botox on top of qulipta insyead changing add botox on top,   Patient complains of symptoms per HPI as well as the following symptoms: none . Pertinent negatives and positives per HPI. All others negative   Brief HPI:  53 year old female who follows in clinic for chronic migraines. MRI brain 09/10/22 was normal.  At her last visit she was continued on Emgality for prevention. Nurtec was prescribed for rescue. Interval History: 08/25/2023 She continued to have up to 9 migraine days per month on Emgality, so she was switched to [place] for prevention 1.5 months ago. Headaches have been about the same on Qulipta as they were on Emgality. She is tolerating it well without side effects. Likes that she does not have to refrigerate it like she did with Emgality.  Nurtec helps relieve her headache but can take several hours to start working.  Migraine days per month: 9 Headache free days per month: 21  Current Headache Regimen: Preventative: Qulipta 30 mg daily Abortive: Nurtec 75 mg PRN   Prior Therapies                                  Rescue: Maxalt 10 mg PRN Imitrex Axert Zomig  pill Frova Migranal Flurbiprofen Ketoprofen Ubrelvy 100 mg PRN Nurtec 75 mg PRN   Preventive: Topamax 175 mg QHS - lack of efficacy Atenolol - lack of efficacy Emgality 120 mg monthly Botox - lack of efficacy Qulipta 30 mg daily Magnesium B2 qulipta  Physical Exam:   Vital Signs: BP 106/71 (BP Location: Right Arm, Patient Position: Sitting, Cuff Size: Normal)   Pulse 81   Ht 5\' 8"  (1.727 m)   Wt 152 lb 9.6 oz (69.2 kg)   BMI 23.20 kg/m    Physical exam: Exam: Gen: NAD, conversant      CV: No palpitations or chest pain or SOB. VS: Breathing at a normal rate. Weight appears within normal limits. Not febrile. Eyes: Conjunctivae clear without exudates or hemorrhage  Neuro: Detailed Neurologic Exam  Speech:    Speech is normal; fluent and spontaneous with normal comprehension.  Cognition:    The patient is oriented to person, place, and time;     recent and remote memory intact;     language fluent;     normal attention, concentration, fund of knowledge Cranial Nerves:    The pupils are equal, round, and reactive to light. Visual fields are full Extraocular movements are intact.  The face is symmetric with normal sensation. The palate elevates in the midline. Hearing intact. Voice is normal. Shoulder shrug  is normal. The tongue has normal motion without fasciculations.   Coordination: normal  Gait:    No abnormalities noted or reported  Motor Observation:   no involuntary movements noted. Tone:    Appears normal  Posture:    Posture is normal. normal erect    Strength:    Strength is anti-gravity and symmetric in the upper and lower limbs.      Sensation: intact to LT, no reports of numbness or tingling or paresthesias       IMPRESSION: 53 year old female who presents for follow up of chronic migraines. She continues to have multiple migraines per month on Qulipta 30 mg daily. Will increase to 60 mg daily. Nurtec helps reduce her headache but takes  several hours to start working. Will try Zomig nasal spray for rescue.  PLAN: -Prevention: continue Qulipta to 60 mg daily -Rescue: Take the triptan and nurtec and even ibuprofen/tylenol etc right at onset and ondansetron (if needed for nausea). Optimize acute management since qulipta is working well. Another options again is to try possibly instead of qulipta these newer preventatives Vyepti, Ajovy or Aimovig BUT if the qulipta is working best than any other than any other med would stay on it and optimize acute management Other thought: If you still have 8 migraine days a month and >15 total headahe days a month can layer botox on top of qulipta insyead changing add botox on top,  -Next steps, consider Aimovig/Ajovy or Vyepti for prevention  Meds ordered this encounter  Medications   rizatriptan (MAXALT-MLT) 10 MG disintegrating tablet    Sig: Take 1 tablet (10 mg total) by mouth as needed for migraine. May repeat in 2 hours if needed    Dispense:  12 tablet    Refill:  11   Atogepant (QULIPTA) 60 MG TABS    Sig: Take 1 tablet (60 mg total) by mouth daily.    Dispense:  30 tablet    Refill:  11   Rimegepant Sulfate (NURTEC) 75 MG TBDP    Sig: Take 1 tablet (75 mg total) by mouth as needed.    Dispense:  16 tablet    Refill:  11   ondansetron (ZOFRAN-ODT) 4 MG disintegrating tablet    Sig: Take 1-2 tablets (4-8 mg total) by mouth every 8 (eight) hours as needed.    Dispense:  20 tablet    Refill:  11     Follow-up: 6 months - 12 months I spent 25 minutes of face-to-face and non-face-to-face time with patient on the  1. Migraine without aura and without status migrainosus, not intractable    diagnosis.  This included previsit chart review, lab review, study review, order entry, electronic health record documentation, patient education on the different diagnostic and therapeutic options, counseling and coordination of care, risks and benefits of management, compliance, or risk factor  reduction

## 2024-02-28 NOTE — Patient Instructions (Addendum)
 Take the triptan and nurtec and even ibuprofen/tylenol etc right at onset and ondansetron (if needed for nausea). Optimize acute management since qulipta is working well. Another options again is to try possibly instead of qulipta these newer preventatives Vyepti, Ajovy or Aimovig BUT if the qulipta is working best than any other than any other med would stay on it and optimize acute management Other thought: If you still have 8 migraine days a month and >15 total headahe days a month can layer botox on top of qulipta insyead changing add botox on top,   Meds ordered this encounter  Medications   rizatriptan (MAXALT-MLT) 10 MG disintegrating tablet    Sig: Take 1 tablet (10 mg total) by mouth as needed for migraine. May repeat in 2 hours if needed    Dispense:  12 tablet    Refill:  11   Atogepant (QULIPTA) 60 MG TABS    Sig: Take 1 tablet (60 mg total) by mouth daily.    Dispense:  30 tablet    Refill:  11   Rimegepant Sulfate (NURTEC) 75 MG TBDP    Sig: Take 1 tablet (75 mg total) by mouth as needed.    Dispense:  16 tablet    Refill:  11   ondansetron (ZOFRAN-ODT) 4 MG disintegrating tablet    Sig: Take 1-2 tablets (4-8 mg total) by mouth every 8 (eight) hours as needed.    Dispense:  20 tablet    Refill:  11

## 2024-04-02 ENCOUNTER — Telehealth: Payer: Self-pay

## 2024-04-02 ENCOUNTER — Other Ambulatory Visit (HOSPITAL_COMMUNITY): Payer: Self-pay

## 2024-04-02 NOTE — Telephone Encounter (Signed)
 Pharmacy Patient Advocate Encounter  Received notification from [employer] that Prior Authorization for Nurtec 75MG  dispersible tablets has been APPROVED from 04/02/2024 to 04/02/2025. Ran test claim, Copay is $0 for 16 tablets/30DS. This test claim was processed through [facility]- copay amounts may vary at other pharmacies due to pharmacy/plan contracts, or as the patient moves through the different stages of their insurance plan.   PA #/Case ID/Reference #: PA Case ID #: [id number]

## 2024-06-06 ENCOUNTER — Other Ambulatory Visit (HOSPITAL_COMMUNITY): Payer: Self-pay

## 2024-06-28 ENCOUNTER — Encounter: Payer: Self-pay | Admitting: Pharmacy Technician

## 2024-06-28 ENCOUNTER — Other Ambulatory Visit (HOSPITAL_COMMUNITY): Payer: Self-pay

## 2024-06-28 NOTE — Telephone Encounter (Signed)
 Error

## 2024-09-04 ENCOUNTER — Telehealth: Admitting: Neurology

## 2024-09-21 ENCOUNTER — Telehealth: Admitting: Neurology

## 2024-10-09 ENCOUNTER — Encounter: Payer: Self-pay | Admitting: Neurology

## 2024-10-09 ENCOUNTER — Ambulatory Visit: Admitting: Neurology

## 2024-10-09 VITALS — BP 106/70 | HR 76

## 2024-10-09 DIAGNOSIS — G4734 Idiopathic sleep related nonobstructive alveolar hypoventilation: Secondary | ICD-10-CM

## 2024-10-09 DIAGNOSIS — R351 Nocturia: Secondary | ICD-10-CM

## 2024-10-09 DIAGNOSIS — Z820 Family history of epilepsy and other diseases of the nervous system: Secondary | ICD-10-CM

## 2024-10-09 DIAGNOSIS — G43719 Chronic migraine without aura, intractable, without status migrainosus: Secondary | ICD-10-CM | POA: Diagnosis not present

## 2024-10-09 DIAGNOSIS — R519 Headache, unspecified: Secondary | ICD-10-CM

## 2024-10-09 NOTE — Patient Instructions (Signed)
 Please avoid drinking alcohol daily.  Alcohol is a known sleep disrupter and may not combine well with your migraine medications. Continue to hydrate well with water. Continue to limit your caffeine to 1 serving per day or less. We will proceed with a home sleep test to evaluate you for sleep apnea.  If you do have obstructive sleep apnea I would likely offer you treatment with a CPAP or AutoPap machine. Continue with Qulipta  60 mg daily and Maxalt  as needed.  You can also have Nurtec as needed for backup. Follow-up in about 6 months to see Duwaine Russell, NP.

## 2024-10-09 NOTE — Progress Notes (Signed)
 Subjective:    Patient ID: Brenda Torres is a 53 y.o. female.  HPI    True Mar, MD, PhD [facility] [address], [address] [address] [place], [place] [address]  Brenda Torres, is a 53 year old female with an underlying medical history of migraine headaches, who presents for follow-up consultation for migraine management.  The patient is unaccompanied today.  She was previously followed in this clinic by Dr. Ines and was last seen in March 2025 at which time she was on Qulipta  for migraine prevention and Maxalt  as needed as well as Nurtec as needed.  She had also seen Dr. Delon Nurse in 2023 as well as 2024.  I reviewed prior records and copied them below for reference.  Today, 10/09/2024: She reports overall doing fairly well on a combination of Qulipta  60 mg once daily and as needed use of Maxalt .  She prefers the Maxalt  over Nurtec, finds it more effective.  She reports that her sleep may not be as good, however tracker app shows some desaturations into the 80s at night.  She has a family history of migraines in her mom.  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  She tries to hydrate well, estimates that she drinks about 90 ounces of water per day.  She limits her caffeine generally to 1 cup of coffee per day and does drink alcohol daily, typically in the form of a mixed drink and occasional beer or wine.  She tries to limit her red wine intake.  She goes to bed generally between 10 and 11 and rise time is around 6:45 AM.  She reports a family history of sleep apnea affecting her father.  She has never had any one-sided weakness or numbness or tingling or droopy face or slurring of speech. She has woken up with a headache in the middle of the night and sometimes first thing in the morning.  She has nocturia about once per average night.  She is up-to-date with her eye examination.  She wears contact lenses.  She sees an optometrist, Dr.  Madelyn.  She had a brain MRI with and without contrast on 09/10/2022 and I reviewed the results:    IMPRESSION: Normal MRI scan of the brain with and without contrast    She has previously tried other medications including Emgality  injections, Botox  injections, she has been on Maxalt  as needed as well.   Previously (copied from previous notes for reference):   02/28/2024 (Dr. Ines): <<She is on the qulipta . She has 4-6 migraine days a month but <8 total headache days a month. Nurtec is hit and miss. She may take it before bed and she still has the headache in the morning but with the triptan it will work better. We discussed acute management strategies: Take the triptan and nurtec and even ibuprofen/tylenol etc right at onset and ondansetron  (if needed for nausea). Optimize acute management since qulipta  is working well. Another options again is to try possibly instead of qulipta  these newer preventatives Vyepti, Ajovy or Aimovig BUT if the qulipta  is working best than any other than any other med would stay on it and optimize acute management Other thought: If you still have 8 migraine days a month and >15 total headahe days a month can layer botox  on top of qulipta  insyead changing add botox  on top,    Patient complains of symptoms per HPI as well as the following symptoms: none . Pertinent negatives and positives  per HPI. All others negative >>     08/25/2023 (Dr. Rush): << She continued to have up to 9 migraine days per month on Emgality , so she was switched to Qulipta  for prevention 1.5 months ago. Headaches have been about the same on Qulipta  as they were on Emgality . She is tolerating it well without side effects. Likes that she does not have to refrigerate it like she did with Emgality .   Nurtec helps relieve her headache but can take several hours to start working.   Migraine days per month: 9 Headache free days per month: 21   Current Headache Regimen: Preventative: Qulipta  30 mg  daily Abortive: Nurtec 75 mg PRN     Prior Therapies                                  Rescue: Maxalt  10 mg PRN Imitrex Axert Zomig  pill Frova Migranal Flurbiprofen Ketoprofen Ubrelvy  100 mg PRN Nurtec 75 mg PRN   Preventive: Topamax 175 mg QHS - lack of efficacy Atenolol - lack of efficacy Emgality  120 mg monthly Botox  - lack of efficacy Qulipta  30 mg daily Magnesium B2 Qulipta  >>  Her Past Medical History Is Significant For: Past Medical History:  Diagnosis Date   Headache    Migraine     Her Past Surgical History Is Significant For: History reviewed. No pertinent surgical history.  Her Family History Is Significant For: Family History  Problem Relation Age of Onset   Hypertension Mother    Migraines Mother    Hypertension Father     Her Social History Is Significant For: Social History   Socioeconomic History   Marital status: Married    Spouse name: Not on file   Number of children: 0   Years of education: Not on file   Highest education level: Not on file  Occupational History   Occupation: scientist, water quality  Tobacco Use   Smoking status: Never   Smokeless tobacco: Never  Vaping Use   Vaping status: Never Used  Substance and Sexual Activity   Alcohol use: Yes    Alcohol/week: 13.0 standard drinks of alcohol    Types: 2 Glasses of wine, 1 Cans of beer, 10 Shots of liquor per week    Comment: 1-2/day   Drug use: Never   Sexual activity: Not on file  Other Topics Concern   Not on file  Social History Narrative   Right handed   Caffeine- 1-2 cups per day    Lives with family    Pt works    Social Drivers of Corporate Investment Banker Strain: Not on [employer] Insecurity: Not on file  Transportation Needs: Not on file  Physical Activity: Not on file  Stress: Not on file  Social Connections: Not on file    Her Allergies Are:  Allergies  Allergen Reactions   Codeine Nausea Only    In college   :   Her Current Medications Are:   Outpatient Encounter Medications as of 10/09/2024  Medication Sig   Atogepant  (QULIPTA ) 60 MG TABS Take 1 tablet (60 mg total) by mouth daily.   Cholecalciferol (VITAMIN D3 PO) Take by mouth.   clindamycin-benzoyl peroxide (BENZACLIN) gel Apply topically 2 (two) times daily.   Coenzyme Q10-Vitamin E (QUNOL ULTRA COQ10 PO) Take by mouth.   levocetirizine (XYZAL) 5 MG tablet Take 5 mg by mouth every evening.   Multiple Vitamin (MULTIVITAMIN  ADULT PO) multivitamin   Multiple Vitamin (MULTIVITAMIN) capsule Take 1 capsule by mouth daily.   norethindrone-ethinyl estradiol-FE (LOESTRIN FE) 1-20 MG-MCG tablet Take 1 tablet by mouth daily.   Omega-3 Fatty Acids (FISH OIL PO) Take by mouth.   ondansetron  (ZOFRAN -ODT) 4 MG disintegrating tablet Take 1-2 tablets (4-8 mg total) by mouth every 8 (eight) hours as needed.   Riboflavin (VITAMIN B2 PO) Take by mouth.   Rimegepant Sulfate (NURTEC) 75 MG TBDP Take 1 tablet (75 mg total) by mouth as needed.   rizatriptan  (MAXALT -MLT) 10 MG disintegrating tablet Take 1 tablet (10 mg total) by mouth as needed for migraine. May repeat in 2 hours if needed   spironolactone (ALDACTONE) 25 MG tablet spironolactone 25 mg tablet   tazarotene (AVAGE) 0.1 % cream Tazorac 0.1 % topical cream  APPLY TO THE AFFECTED AREA(S) BY TOPICAL ROUTE ONCE DAILY   Triamcinolone Acetonide (NASACORT ALLERGY 24HR NA) Place into the nose.   Turmeric (QC TUMERIC COMPLEX PO) Take by mouth.   zolmitriptan  (ZOMIG ) 5 MG nasal solution Place 1 spray into the nose as needed for migraine. May repeat a dose after 2 hours if migraine persists. Max dose 2 sprays in 24 hours   cetirizine (ZYRTEC) 10 MG tablet Zyrtec 10 mg tablet  Take 1 tablet every day by oral route. (Patient not taking: Reported on 02/28/2024)   No facility-administered encounter medications on file as of 10/09/2024.  : Review of Systems:  Out of a complete 14 point review of systems, all are reviewed and negative with the  exception of these symptoms as listed below:  Review of Systems  Objective:  Neurological Exam  Physical Exam Physical Examination:   Vitals:   10/09/24 1511  BP: 106/70  Pulse: 76    General Examination: The patient is a very pleasant 53 y.o. female in no acute distress. She appears well-developed and well-nourished and well groomed.   HEENT: Normocephalic, atraumatic, pupils are equal, round and reactive to light, extraocular tracking is good without limitation to gaze excursion or nystagmus noted. No photophobia.  Contact lenses in place, funduscopic exam benign.  Hearing is grossly intact.  Face is symmetric with normal facial animation. Speech is clear without dysarthria. There is no hypophonia. There is no lip, neck/head, jaw or voice tremor. Neck is supple with full range of passive and active motion. There are no carotid bruits on auscultation.  Airway/Oropharynx exam reveals: No significant mouth dryness, palatal tori noted, no significant airway crowding with Mallampati class I, smaller tonsils and small uvula.  Tongue protrudes centrally and palate elevates symmetrically.   Chest: Clear to auscultation without wheezing, rhonchi or crackles noted.  Heart: S1+S2+0, regular and normal without murmurs, rubs or gallops noted.   Abdomen: Soft, non-tender and non-distended.  Extremities: There is no pitting edema in the distal lower extremities bilaterally.   Skin: Warm and dry without trophic changes noted.   Musculoskeletal: exam reveals no obvious joint deformities.   Neurologically:  Mental status: The patient is awake, alert and oriented in all 4 spheres. Her immediate and remote memory, attention, language skills and fund of knowledge are appropriate. There is no evidence of aphasia, agnosia, apraxia or anomia. Speech is clear with normal prosody and enunciation. Thought process is linear. Mood is normal and affect is normal.  Cranial nerves II - XII are as described  above under HEENT exam.  Motor exam: Normal bulk, strength and tone is noted. There is no obvious action or resting tremor.  Fine motor skills and coordination: Intact finger taps, hand movements and rapid alternating patting with both upper extremities, normal foot taps bilaterally in the lower extremities.  Cerebellar testing: No dysmetria or intention tremor. There is no truncal or gait ataxia.  Normal finger-to-nose, normal heel-to-shin bilaterally. Sensory exam: intact to light touch in the upper and lower extremities.  Reflexes 1+ throughout, toes are downgoing. Romberg negative. Gait, station and balance: She stands easily. No veering to one side is noted. No leaning to one side is noted. Posture is age-appropriate and stance is narrow based. Gait shows normal stride length and normal pace. No problems turning are noted.  Normal tandem walk.  Assessment and plan:   In summary, Brenda Torres is a very pleasant 53 y.o.-year old female with an underlying medical history of migraine headaches, who presents for follow-up consultation for migraine management.  She has a history of chronic migraines.  She has been on multiple different preventative and acute medications.  She is currently not utilizing Zomig .  She has had reasonable success with Maxalt , used about 15 pills in the past 3 months.  We talked about migraine triggers, alleviating factors and other contributors such as sleep disordered breathing today.  She has never had a sleep study and would be willing to proceed with a home sleep test.  She is discouraged from utilizing alcohol daily.  She is advised to continue with Qulipta  once daily and Maxalt  as needed and use Nurtec as needed as a backup acute medication.  Neurological exam is nonfocal.  She has previously had a brain MRI which was benign.  She is up-to-date with her eye examination.  She is advised to follow-up routinely in this clinic in about 6 months to see the nurse  practitioner.  We will call her to schedule her home sleep test and also with the results.  If she has obstructive sleep apnea I would likely offer her treatment in the form of AutoPap therapy.  I answered all her questions today and she was in agreement with our plan.   Below is a summary of my recommendations and our discussion points from today's visit, based on chart review, history and examination. They were given these instructions verbally during the visit in detail and also in writing in the MyChart after visit summary (AVS), which they can access electronically. << Please avoid drinking alcohol daily.  Alcohol is a known sleep disrupter and may not combine well with your migraine medications. Continue to hydrate well with water. Continue to limit your caffeine to 1 serving per day or less. We will proceed with a home sleep test to evaluate you for sleep apnea.  If you do have obstructive sleep apnea I would likely offer you treatment with a CPAP or AutoPap machine. Continue with Qulipta  60 mg daily and Maxalt  as needed.  You can also have Nurtec as needed for backup. Follow-up in about 6 months to see Duwaine Russell, NP.>>   I spent 45 minutes in total face-to-face time and in reviewing records during pre-charting, more than 50% of which was spent in counseling and coordination of care, reviewing test results, reviewing medications and treatment regimen and/or in discussing or reviewing the diagnosis of migraine headaches, risk of sleep apnea, the prognosis and treatment options. Pertinent laboratory and imaging test results that were available during this visit with the patient were reviewed by me and considered in my medical decision making (see chart for details).

## 2025-04-23 ENCOUNTER — Telehealth: Admitting: Adult Health

## 2025-10-14 ENCOUNTER — Ambulatory Visit: Admitting: Adult Health
